# Patient Record
Sex: Female | Born: 1955
Health system: Southern US, Community
[De-identification: ages and names within clinical notes are randomized; demographics above are authoritative.]

## PROBLEM LIST (undated history)

## (undated) DIAGNOSIS — M810 Age-related osteoporosis without current pathological fracture: Secondary | ICD-10-CM

## (undated) DIAGNOSIS — I10 Essential (primary) hypertension: Secondary | ICD-10-CM

## (undated) DIAGNOSIS — R200 Anesthesia of skin: Secondary | ICD-10-CM

## (undated) DIAGNOSIS — G43909 Migraine, unspecified, not intractable, without status migrainosus: Secondary | ICD-10-CM

## (undated) HISTORY — PX: WISDOM TOOTH EXTRACTION: SHX21

## (undated) HISTORY — DX: Migraine, unspecified, not intractable, without status migrainosus: G43.909

## (undated) HISTORY — DX: Age-related osteoporosis without current pathological fracture: M81.0

## (undated) HISTORY — DX: Anesthesia of skin: R20.0

## (undated) HISTORY — PX: TOOTH EXTRACTION: SUR596

## (undated) HISTORY — DX: Essential (primary) hypertension: I10

---

## 1998-07-28 ENCOUNTER — Ambulatory Visit (HOSPITAL_COMMUNITY): Admission: RE | Admit: 1998-07-28 | Discharge: 1998-07-28 | Payer: Self-pay

## 2000-10-16 ENCOUNTER — Ambulatory Visit (HOSPITAL_COMMUNITY): Admission: RE | Admit: 2000-10-16 | Discharge: 2000-10-16 | Payer: Self-pay | Admitting: Obstetrics and Gynecology

## 2000-10-16 ENCOUNTER — Encounter: Payer: Self-pay | Admitting: Obstetrics and Gynecology

## 2001-06-03 ENCOUNTER — Encounter: Payer: Self-pay | Admitting: Internal Medicine

## 2001-06-03 ENCOUNTER — Encounter: Admission: RE | Admit: 2001-06-03 | Discharge: 2001-06-03 | Payer: Self-pay | Admitting: Internal Medicine

## 2002-01-23 ENCOUNTER — Encounter: Payer: Self-pay | Admitting: Obstetrics and Gynecology

## 2002-01-23 ENCOUNTER — Ambulatory Visit (HOSPITAL_COMMUNITY): Admission: RE | Admit: 2002-01-23 | Discharge: 2002-01-23 | Payer: Self-pay | Admitting: Obstetrics and Gynecology

## 2003-05-17 ENCOUNTER — Ambulatory Visit (HOSPITAL_COMMUNITY): Admission: RE | Admit: 2003-05-17 | Discharge: 2003-05-17 | Payer: Self-pay | Admitting: Obstetrics and Gynecology

## 2003-05-31 ENCOUNTER — Ambulatory Visit (HOSPITAL_COMMUNITY): Admission: RE | Admit: 2003-05-31 | Discharge: 2003-05-31 | Payer: Self-pay | Admitting: Internal Medicine

## 2005-04-25 ENCOUNTER — Ambulatory Visit (HOSPITAL_COMMUNITY): Admission: RE | Admit: 2005-04-25 | Discharge: 2005-04-25 | Payer: Self-pay | Admitting: Obstetrics and Gynecology

## 2006-07-23 ENCOUNTER — Ambulatory Visit (HOSPITAL_COMMUNITY): Admission: RE | Admit: 2006-07-23 | Discharge: 2006-07-23 | Payer: Self-pay | Admitting: Obstetrics & Gynecology

## 2006-12-09 ENCOUNTER — Ambulatory Visit: Payer: Self-pay | Admitting: Gastroenterology

## 2006-12-09 ENCOUNTER — Ambulatory Visit (HOSPITAL_COMMUNITY): Admission: RE | Admit: 2006-12-09 | Discharge: 2006-12-09 | Payer: Self-pay | Admitting: Gastroenterology

## 2007-08-14 ENCOUNTER — Ambulatory Visit (HOSPITAL_COMMUNITY): Admission: RE | Admit: 2007-08-14 | Discharge: 2007-08-14 | Payer: Self-pay | Admitting: Internal Medicine

## 2007-08-22 ENCOUNTER — Ambulatory Visit (HOSPITAL_COMMUNITY): Admission: RE | Admit: 2007-08-22 | Discharge: 2007-08-22 | Payer: Self-pay | Admitting: Obstetrics & Gynecology

## 2008-09-17 ENCOUNTER — Ambulatory Visit (HOSPITAL_COMMUNITY): Admission: RE | Admit: 2008-09-17 | Discharge: 2008-09-17 | Payer: Self-pay | Admitting: Obstetrics & Gynecology

## 2008-09-22 ENCOUNTER — Encounter: Admission: RE | Admit: 2008-09-22 | Discharge: 2008-09-22 | Payer: Self-pay | Admitting: Obstetrics & Gynecology

## 2009-10-24 ENCOUNTER — Ambulatory Visit (HOSPITAL_COMMUNITY): Admission: RE | Admit: 2009-10-24 | Discharge: 2009-10-24 | Payer: Self-pay | Admitting: Obstetrics & Gynecology

## 2010-09-12 NOTE — Op Note (Signed)
Traci Strong, Traci Strong                 ACCOUNT NO.:  [id number]   MEDICAL RECORD NO.:  [id number]          PATIENT TYPE:  AMB   LOCATION:  DAY                           FACILITY:  [facility]   PHYSICIAN:  Kassie Mends, M.D.      DATE OF BIRTH:  1955-05-19   DATE OF PROCEDURE:  12/09/2006  DATE OF DISCHARGE:                               OPERATIVE REPORT   PROCEDURE:  Colonoscopy.   INDICATION FOR EXAM:  Traci Strong is a 55 year old female who presents for  average-risk colon cancer screening.   FINDINGS:  1. Sigmoid colon diverticulosis.  Otherwise, no polyps, masses,      inflammatory changes, or AVMs seen.  2. Moderate internal hemorrhoids.  Otherwise, normal retroflexed view      of the rectum.   RECOMMENDATIONS:  1. She should follow a high-fiber diet.  She was given a handout on a      high-fiber diet, hemorrhoids, and diverticulosis.  2. Screening colonoscopy in 10 years.   MEDICATIONS:  1. Demerol 75 mg IV.  2. Versed 4 mg IV.   PROCEDURE TECHNIQUE:  Physical exam was performed, and informed consent  was obtained from the patient after explaining the benefits, risks, and  alternatives to the procedure.  The patient was connected to the monitor  and placed in the left lateral position.  Continuous oxygen was provided  by nasal cannula and IV medicine administered through an indwelling  cannula.  After administration of sedation and rectal exam, the  patient's rectum was intubated, and the scope was advanced under direct  visualization to the cecum.  The scope was withdrawn slowly by carefully  examining the color, texture, anatomy, and integrity of the mucosa on  the way out.  The patient was recovered in endoscopy and discharged home  in satisfactory condition.      Kassie Mends, M.D.  Electronically Signed    SM/MEDQ  D:  12/09/2006  T:  12/10/2006  Job:  [id number]   cc:   Tesfaye D. Felecia Shelling, MD  Fax: [phone]

## 2010-10-03 ENCOUNTER — Other Ambulatory Visit (HOSPITAL_COMMUNITY): Payer: Self-pay | Admitting: Obstetrics & Gynecology

## 2010-10-03 DIAGNOSIS — Z1231 Encounter for screening mammogram for malignant neoplasm of breast: Secondary | ICD-10-CM

## 2010-11-02 ENCOUNTER — Ambulatory Visit (HOSPITAL_COMMUNITY)
Admission: RE | Admit: 2010-11-02 | Discharge: 2010-11-02 | Disposition: A | Discharge status: Home / Self Care | Payer: BC Managed Care – PPO | Source: Ambulatory Visit | Attending: Obstetrics & Gynecology | Admitting: Obstetrics & Gynecology

## 2010-11-02 DIAGNOSIS — Z1231 Encounter for screening mammogram for malignant neoplasm of breast: Secondary | ICD-10-CM

## 2011-10-15 ENCOUNTER — Other Ambulatory Visit (HOSPITAL_COMMUNITY): Payer: Self-pay | Admitting: Obstetrics & Gynecology

## 2011-10-15 DIAGNOSIS — Z1231 Encounter for screening mammogram for malignant neoplasm of breast: Secondary | ICD-10-CM

## 2011-11-14 ENCOUNTER — Ambulatory Visit (HOSPITAL_COMMUNITY)
Admission: RE | Admit: 2011-11-14 | Discharge: 2011-11-14 | Disposition: A | Discharge status: Home / Self Care | Payer: BC Managed Care – PPO | Source: Ambulatory Visit | Attending: Obstetrics & Gynecology | Admitting: Obstetrics & Gynecology

## 2011-11-14 DIAGNOSIS — Z1231 Encounter for screening mammogram for malignant neoplasm of breast: Secondary | ICD-10-CM | POA: Insufficient documentation

## 2012-09-30 ENCOUNTER — Other Ambulatory Visit (HOSPITAL_COMMUNITY): Payer: Self-pay | Admitting: Internal Medicine

## 2012-09-30 ENCOUNTER — Ambulatory Visit (HOSPITAL_COMMUNITY)
Admission: RE | Admit: 2012-09-30 | Discharge: 2012-09-30 | Disposition: A | Discharge status: Home / Self Care | Payer: BC Managed Care – PPO | Source: Ambulatory Visit | Attending: Internal Medicine | Admitting: Internal Medicine

## 2012-09-30 DIAGNOSIS — W19XXXA Unspecified fall, initial encounter: Secondary | ICD-10-CM

## 2012-09-30 DIAGNOSIS — M25539 Pain in unspecified wrist: Secondary | ICD-10-CM | POA: Insufficient documentation

## 2012-09-30 DIAGNOSIS — M79609 Pain in unspecified limb: Secondary | ICD-10-CM | POA: Insufficient documentation

## 2012-09-30 DIAGNOSIS — S6990XA Unspecified injury of unspecified wrist, hand and finger(s), initial encounter: Secondary | ICD-10-CM | POA: Insufficient documentation

## 2012-09-30 DIAGNOSIS — S59909A Unspecified injury of unspecified elbow, initial encounter: Secondary | ICD-10-CM | POA: Insufficient documentation

## 2012-09-30 DIAGNOSIS — S59919A Unspecified injury of unspecified forearm, initial encounter: Secondary | ICD-10-CM | POA: Insufficient documentation

## 2012-10-16 ENCOUNTER — Other Ambulatory Visit (HOSPITAL_COMMUNITY): Payer: Self-pay | Admitting: Obstetrics & Gynecology

## 2012-10-16 DIAGNOSIS — Z1231 Encounter for screening mammogram for malignant neoplasm of breast: Secondary | ICD-10-CM

## 2012-12-03 ENCOUNTER — Ambulatory Visit (HOSPITAL_COMMUNITY)
Admission: RE | Admit: 2012-12-03 | Discharge: 2012-12-03 | Disposition: A | Discharge status: Home / Self Care | Payer: BC Managed Care – PPO | Source: Ambulatory Visit | Attending: Obstetrics & Gynecology | Admitting: Obstetrics & Gynecology

## 2012-12-03 DIAGNOSIS — Z1231 Encounter for screening mammogram for malignant neoplasm of breast: Secondary | ICD-10-CM | POA: Insufficient documentation

## 2014-03-02 ENCOUNTER — Ambulatory Visit (HOSPITAL_COMMUNITY)
Admission: RE | Admit: 2014-03-02 | Discharge: 2014-03-02 | Disposition: A | Discharge status: Home / Self Care | Payer: BC Managed Care – PPO | Source: Ambulatory Visit | Attending: Internal Medicine | Admitting: Internal Medicine

## 2014-03-02 ENCOUNTER — Other Ambulatory Visit (HOSPITAL_COMMUNITY): Payer: Self-pay | Admitting: Internal Medicine

## 2014-03-02 DIAGNOSIS — W19XXXA Unspecified fall, initial encounter: Secondary | ICD-10-CM | POA: Insufficient documentation

## 2014-03-02 DIAGNOSIS — M25512 Pain in left shoulder: Secondary | ICD-10-CM

## 2014-03-26 ENCOUNTER — Other Ambulatory Visit (HOSPITAL_COMMUNITY): Payer: Self-pay | Admitting: Obstetrics & Gynecology

## 2014-03-26 DIAGNOSIS — Z1231 Encounter for screening mammogram for malignant neoplasm of breast: Secondary | ICD-10-CM

## 2014-04-12 ENCOUNTER — Ambulatory Visit (HOSPITAL_COMMUNITY): Payer: BC Managed Care – PPO

## 2015-09-27 ENCOUNTER — Encounter: Payer: Self-pay | Admitting: Obstetrics & Gynecology

## 2016-09-27 ENCOUNTER — Other Ambulatory Visit: Payer: Self-pay | Admitting: Obstetrics & Gynecology

## 2016-09-27 DIAGNOSIS — Z1231 Encounter for screening mammogram for malignant neoplasm of breast: Secondary | ICD-10-CM

## 2016-10-04 ENCOUNTER — Ambulatory Visit (INDEPENDENT_AMBULATORY_CARE_PROVIDER_SITE_OTHER): Payer: BC Managed Care – PPO | Admitting: Obstetrics & Gynecology

## 2016-10-04 ENCOUNTER — Encounter: Payer: Self-pay | Admitting: Obstetrics & Gynecology

## 2016-10-04 ENCOUNTER — Ambulatory Visit
Admission: RE | Admit: 2016-10-04 | Discharge: 2016-10-04 | Disposition: A | Discharge status: Home / Self Care | Payer: BC Managed Care – PPO | Source: Ambulatory Visit | Attending: Obstetrics & Gynecology | Admitting: Obstetrics & Gynecology

## 2016-10-04 VITALS — BP 130/86 | Ht 61.5 in | Wt 163.0 lb

## 2016-10-04 DIAGNOSIS — Z01419 Encounter for gynecological examination (general) (routine) without abnormal findings: Secondary | ICD-10-CM | POA: Diagnosis not present

## 2016-10-04 DIAGNOSIS — Z780 Asymptomatic menopausal state: Secondary | ICD-10-CM

## 2016-10-04 DIAGNOSIS — Z1231 Encounter for screening mammogram for malignant neoplasm of breast: Secondary | ICD-10-CM

## 2016-10-04 NOTE — Addendum Note (Signed)
Addended by: Berna SpareASTILLO, Sou Nohr on: 10/04/2016 10:35 AM   Modules accepted: Orders, SmartSet

## 2016-10-04 NOTE — Progress Notes (Signed)
    Traci Strong 06-Nov-1955 [id number]   History:    61 y.o. G1P1 divorced/retired.  Boyfriend but not sexually active  RP:  Established patient presenting for annual gyn exam   HPI:  Menopause.  No HRT.  No PMB.  No pelvic pain.  No vaginal d/c.  Breasts wnl.  Mictions/BMs wnl.  Past medical history,surgical history, family history and social history were all reviewed and documented in the EPIC chart.  Gynecologic History No LMP recorded. Patient is postmenopausal. Contraception: abstinence and post menopausal status Last Pap: 08/2015. Results were: normal/HPV HR neg (but h/o HPV HR pos) Last mammogram: 08/2015. Results were: normal  Obstetric History OB History  Gravida Para Term Preterm AB Living  1 1       1   SAB TAB Ectopic Multiple Live Births               # Outcome Date GA Lbr Len/2nd Weight Sex Delivery Anes PTL Lv  1 Para                ROS: A ROS was performed and pertinent positives and negatives are included in the history.  GENERAL: No fevers or chills. HEENT: No change in vision, no earache, sore throat or sinus congestion. NECK: No pain or stiffness. CARDIOVASCULAR: No chest pain or pressure. No palpitations. PULMONARY: No shortness of breath, cough or wheeze. GASTROINTESTINAL: No abdominal pain, nausea, vomiting or diarrhea, melena or bright red blood per rectum. GENITOURINARY: No urinary frequency, urgency, hesitancy or dysuria. MUSCULOSKELETAL: No joint or muscle pain, no back pain, no recent trauma. DERMATOLOGIC: No rash, no itching, no lesions. ENDOCRINE: No polyuria, polydipsia, no heat or cold intolerance. No recent change in weight. HEMATOLOGICAL: No anemia or easy bruising or bleeding. NEUROLOGIC: No headache, seizures, numbness, tingling or weakness. PSYCHIATRIC: No depression, no loss of interest in normal activity or change in sleep pattern.     Exam:   BP 130/86   Ht 5' 1.5" (1.562 m)   Wt 163 lb (73.9 kg)   BMI 30.30 kg/m   Body mass index is  30.3 kg/m.  General appearance : Well developed well nourished female. No acute distress HEENT: Eyes: no retinal hemorrhage or exudates,  Neck supple, trachea midline, no carotid bruits, no thyroidmegaly Lungs: Clear to auscultation, no rhonchi or wheezes, or rib retractions  Heart: Regular rate and rhythm, no murmurs or gallops Breast:Examined in sitting and supine position were symmetrical in appearance, no palpable masses or tenderness,  no skin retraction, no nipple inversion, no nipple discharge, no skin discoloration, no axillary or supraclavicular lymphadenopathy Abdomen: no palpable masses or tenderness, no rebound or guarding Extremities: no edema or skin discoloration or tenderness  Pelvic:  Bartholin, Urethra, Skene Glands: Within normal limits             Vagina: No gross lesions or discharge  Cervix: No gross lesions or discharge.  Pap reflex done  Uterus  AV, normal size, shape and consistency, non-tender and mobile  Adnexa  Without masses or tenderness  Anus and perineum  normal      Assessment/Plan:  61 y.o. female for annual exam   1. Encounter for routine gynecological examination with Papanicolaou smear of cervix Normal Gyn exam.  Pap reflex done re h/o HPV HR pos.  Breasts wnl.  [facility]  2. Menopause present No HRT.  No PMB.    Genia DelMarie-Lyne Chasity Outten MD, 9:51 AM 10/04/2016

## 2016-10-04 NOTE — Patient Instructions (Signed)
1. Encounter for routine gynecological examination with Papanicolaou smear of cervix Normal Gyn exam.  Pap reflex done re h/o HPV HR pos.  Breasts wnl.  [facility].  2. Menopause present No HRT.  No PMB.    Traci Strong, it was a pleasure to see you today!  I will inform you of your results as soon as available.

## 2016-10-05 LAB — PAP IG W/ RFLX HPV ASCU

## 2016-12-03 ENCOUNTER — Telehealth: Payer: Self-pay | Admitting: Gastroenterology

## 2016-12-03 NOTE — Telephone Encounter (Signed)
Sept recall for tcs °

## 2016-12-04 NOTE — Telephone Encounter (Signed)
Letter mailed to pt.  

## 2017-08-13 ENCOUNTER — Other Ambulatory Visit: Payer: Self-pay | Admitting: Obstetrics & Gynecology

## 2017-08-13 DIAGNOSIS — Z139 Encounter for screening, unspecified: Secondary | ICD-10-CM

## 2017-10-16 ENCOUNTER — Encounter: Payer: BC Managed Care – PPO | Admitting: Women's Health

## 2017-10-16 ENCOUNTER — Ambulatory Visit
Admission: RE | Admit: 2017-10-16 | Discharge: 2017-10-16 | Disposition: A | Discharge status: Home / Self Care | Payer: BC Managed Care – PPO | Source: Ambulatory Visit | Attending: Obstetrics & Gynecology | Admitting: Obstetrics & Gynecology

## 2017-10-16 DIAGNOSIS — Z139 Encounter for screening, unspecified: Secondary | ICD-10-CM

## 2017-10-17 ENCOUNTER — Encounter: Payer: Self-pay | Admitting: Obstetrics & Gynecology

## 2017-10-17 ENCOUNTER — Ambulatory Visit: Payer: BC Managed Care – PPO | Admitting: Obstetrics & Gynecology

## 2017-10-17 VITALS — BP 132/82 | Ht 60.5 in | Wt 164.0 lb

## 2017-10-17 DIAGNOSIS — Z01419 Encounter for gynecological examination (general) (routine) without abnormal findings: Secondary | ICD-10-CM | POA: Diagnosis not present

## 2017-10-17 DIAGNOSIS — Z780 Asymptomatic menopausal state: Secondary | ICD-10-CM | POA: Diagnosis not present

## 2017-10-17 DIAGNOSIS — Z13820 Encounter for screening for osteoporosis: Secondary | ICD-10-CM | POA: Diagnosis not present

## 2017-10-17 NOTE — Progress Notes (Signed)
Traci Strong 10/09/1955 [id number]   History:    62 y.o. G1P1L1 Divorced.  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  No PMB.  No pelvic pain.  Normal vaginal secretions.  Abstinent.  Breasts wnl.  Urine/BMs wnl.  BMI 31.50.  Planning to get back into fitness.  Health labs with Fam MD.  Past medical history,surgical history, family history and social history were all reviewed and documented in the EPIC chart.  Gynecologic History No LMP recorded. Patient is postmenopausal. Contraception: abstinence and post menopausal status Last Pap: 09/2016. Results were: Negative.   Last mammogram: 10/16/2017. Results were: Negative Bone Density: 10 yrs ago per patient, will schedule here now Colonoscopy: Will organize through her Fam MD, may do Cologuard  Obstetric History OB History  Gravida Para Term Preterm AB Living  1 1       1   SAB TAB Ectopic Multiple Live Births               # Outcome Date GA Lbr Len/2nd Weight Sex Delivery Anes PTL Lv  1 Para              ROS: A ROS was performed and pertinent positives and negatives are included in the history.  GENERAL: No fevers or chills. HEENT: No change in vision, no earache, sore throat or sinus congestion. NECK: No pain or stiffness. CARDIOVASCULAR: No chest pain or pressure. No palpitations. PULMONARY: No shortness of breath, cough or wheeze. GASTROINTESTINAL: No abdominal pain, nausea, vomiting or diarrhea, melena or bright red blood per rectum. GENITOURINARY: No urinary frequency, urgency, hesitancy or dysuria. MUSCULOSKELETAL: No joint or muscle pain, no back pain, no recent trauma. DERMATOLOGIC: No rash, no itching, no lesions. ENDOCRINE: No polyuria, polydipsia, no heat or cold intolerance. No recent change in weight. HEMATOLOGICAL: No anemia or easy bruising or bleeding. NEUROLOGIC: No headache, seizures, numbness, tingling or weakness. PSYCHIATRIC: No  depression, no loss of interest in normal activity or change in sleep pattern.     Exam:   BP 132/82   Ht 5' 0.5" (1.537 m)   Wt 164 lb (74.4 kg)   BMI 31.50 kg/m   Body mass index is 31.5 kg/m.  General appearance : Well developed well nourished female. No acute distress HEENT: Eyes: no retinal hemorrhage or exudates,  Neck supple, trachea midline, no carotid bruits, no thyroidmegaly Lungs: Clear to auscultation, no rhonchi or wheezes, or rib retractions  Heart: Regular rate and rhythm, no murmurs or gallops Breast:Examined in sitting and supine position were symmetrical in appearance, no palpable masses or tenderness,  no skin retraction, no nipple inversion, no nipple discharge, no skin discoloration, no axillary or supraclavicular lymphadenopathy Abdomen: no palpable masses or tenderness, no rebound or guarding Extremities: no edema or skin discoloration or tenderness  Pelvic: Vulva: Normal             Vagina: No gross lesions or discharge  Cervix: No gross lesions or discharge  Uterus  AV, normal size, shape and consistency, non-tender and mobile  Adnexa  Without masses or tenderness  Anus: Normal   Assessment/Plan:  62 y.o. female for annual exam   1. Well female exam with routine gynecological exam Normal gynecologic exam and menopause.  Pap test negative in June 2018.  Breast exam normal.  Screening mammogram negative in June 2019.  Health labs with family physician.  Will organize colonoscopy or Cologuard through her  family physician.  2. Menopause present Well on no hormone replacement therapy.  No postmenopausal bleeding.  3. Screening for osteoporosis Recommend vitamin D supplements, calcium rich nutrition and regular weightbearing physical activity.  Will schedule bone density here now. - DG Bone Density; Future  [name] MD, 9:40 AM 10/17/2017

## 2017-10-17 NOTE — Patient Instructions (Signed)
1. Well female exam with routine gynecological exam Normal gynecologic exam and menopause.  Pap test negative in June 2018.  Breast exam normal.  Screening mammogram negative in June 2019.  Health labs with family physician.  Will organize colonoscopy or Cologuard through her family physician.  2. Menopause present Well on no hormone replacement therapy.  No postmenopausal bleeding.  3. Screening for osteoporosis Recommend vitamin D supplements, calcium rich nutrition and regular weightbearing physical activity.  Will schedule bone density here now. - DG Bone Density; Future  Irving Burtonmily, it was a pleasure seeing you today!  I will inform you of your bone density results when available.

## 2017-10-21 ENCOUNTER — Other Ambulatory Visit: Payer: Self-pay | Admitting: Gynecology

## 2017-10-21 DIAGNOSIS — Z13820 Encounter for screening for osteoporosis: Secondary | ICD-10-CM

## 2017-10-24 ENCOUNTER — Ambulatory Visit (INDEPENDENT_AMBULATORY_CARE_PROVIDER_SITE_OTHER): Payer: BC Managed Care – PPO

## 2017-10-24 ENCOUNTER — Other Ambulatory Visit: Payer: Self-pay | Admitting: Gynecology

## 2017-10-24 DIAGNOSIS — Z13820 Encounter for screening for osteoporosis: Secondary | ICD-10-CM

## 2017-10-24 DIAGNOSIS — M810 Age-related osteoporosis without current pathological fracture: Secondary | ICD-10-CM | POA: Diagnosis not present

## 2017-10-28 ENCOUNTER — Encounter: Payer: Self-pay | Admitting: Gynecology

## 2017-10-28 ENCOUNTER — Telehealth: Payer: Self-pay | Admitting: Gynecology

## 2017-10-28 DIAGNOSIS — M810 Age-related osteoporosis without current pathological fracture: Secondary | ICD-10-CM

## 2017-10-28 HISTORY — DX: Age-related osteoporosis without current pathological fracture: M81.0

## 2017-10-28 NOTE — Telephone Encounter (Signed)
Tell patient her most recent bone density shows osteoporosis. Recommend office visit with Dr Lavoie to discuss treatment options 

## 2017-10-29 NOTE — Telephone Encounter (Signed)
Patient informed, transferred to front desk.

## 2017-12-10 ENCOUNTER — Ambulatory Visit: Payer: BLUE CROSS/BLUE SHIELD | Admitting: Obstetrics & Gynecology

## 2017-12-10 ENCOUNTER — Encounter: Payer: Self-pay | Admitting: Obstetrics & Gynecology

## 2017-12-10 VITALS — BP 126/84

## 2017-12-10 DIAGNOSIS — M810 Age-related osteoporosis without current pathological fracture: Secondary | ICD-10-CM

## 2017-12-10 MED ORDER — RISEDRONATE SODIUM 150 MG PO TABS
150.0000 mg | ORAL_TABLET | ORAL | 4 refills | Status: DC
Start: 2017-12-10 — End: 2018-11-20

## 2017-12-10 NOTE — Progress Notes (Signed)
    Traci Strong 07/21/1955 [id number]        62 y.o.  G1P1 Divorced.  Companion.  RP: Management of Osteoporosis  HPI: No change since visit on 10/17/2017:  Menopause, well on no HRT.  No PMB.  No pelvic pain.  Normal vaginal secretions.  Abstinent.  Breasts wnl.  Urine/BMs wnl.  BMI 31.50.  Planning to get back into fitness.  Health labs with Fam MD.    OB History  Gravida Para Term Preterm AB Living  1 1       1   SAB TAB Ectopic Multiple Live Births               # Outcome Date GA Lbr Len/2nd Weight Sex Delivery Anes PTL Lv  1 Para             Past medical history,surgical history, problem list, medications, allergies, family history and social history were all reviewed and documented in the EPIC chart.   Directed ROS with pertinent positives and negatives documented in the history of present illness/assessment and plan.  Exam:  Vitals:   12/10/17 1522  BP: 126/84   General appearance:  Normal  Bone Density 10/24/2017:  Lumbar spine L1, L3-4 T-Score -2.7.  Other sites with Osteopenia.   Assessment/Plan:  62 y.o. G1P1   1. Age-related osteoporosis without current pathological fracture Menopause on no hormone replacement therapy.  Osteoporosis with a T score at -2.7 at the lumbar spine on bone density June 2019.  No history of fragility fracture.  No particular fall risk.  Risks and complications associated with fractures reviewed.  No history of GERD.  Management of osteoporosis discussed thoroughly with patient.  Different treatment options reviewed.  Decision to start on Risedronate (Actonel) 150 mg tablet per mouth monthly.  Usage, risks and benefits reviewed with patient.  If tolerates well, will continue at least 2 years when a repeat bone density will be done.  Also strongly recommended to take vitamin D supplements, a vitamin D level was drawn today, calcium intake of 1.2 to 1.5 g/day including nutritional and supplemental calcium, and increased weightbearing physical  activity also recommended. - VITAMIN D 25 Hydroxy (Vit-D Deficiency, Fractures)  Other orders - risedronate (ACTONEL) 150 MG tablet; Take 1 tablet (150 mg total) by mouth every 30 (thirty) days. with water on empty stomach, nothing by mouth or lie down for next 30 minutes.  Counseling on above issues and coordination of care more than 50% for 25 minutes.  Genia DelMarie-Lyne Maleena Eddleman MD, 3:40 PM 12/10/2017

## 2017-12-11 LAB — VITAMIN D 25 HYDROXY (VIT D DEFICIENCY, FRACTURES): Vit D, 25-Hydroxy: 32 ng/mL (ref 30–100)

## 2017-12-12 ENCOUNTER — Encounter: Payer: Self-pay | Admitting: Obstetrics & Gynecology

## 2017-12-12 NOTE — Patient Instructions (Addendum)
1. Age-related osteoporosis without current pathological fracture Menopause on no hormone replacement therapy.  Osteoporosis with a T score at -2.7 at the lumbar spine on bone density June 2019.  No history of fragility fracture.  No particular fall risk.  Risks and complications associated with fractures reviewed.  No history of GERD.  Management of osteoporosis discussed thoroughly with patient.  Different treatment options reviewed.  Decision to start on Risedronate (Actonel) 150 mg tablet per mouth monthly.  Usage, risks and benefits reviewed with patient.  If tolerates well, will continue at least 2 years when a repeat bone density will be done.  Also strongly recommended to take vitamin D supplements, a vitamin D level was drawn today, calcium intake of 1.2 to 1.5 g/day including nutritional and supplemental calcium, and increased weightbearing physical activity also recommended. - VITAMIN D 25 Hydroxy (Vit-D Deficiency, Fractures)  Other orders - risedronate (ACTONEL) 150 MG tablet; Take 1 tablet (150 mg total) by mouth every 30 (thirty) days. with water on empty stomach, nothing by mouth or lie down for next 30 minutes.  Traci Strong, good seeing you today!   Osteoporosis Osteoporosis is the thinning and loss of density in the bones. Osteoporosis makes the bones more brittle, fragile, and likely to break (fracture). Over time, osteoporosis can cause the bones to become so weak that they fracture after a simple fall. The bones most likely to fracture are the bones in the hip, wrist, and spine. What are the causes? The exact cause is not known. What increases the risk? Anyone can develop osteoporosis. You may be at greater risk if you have a family history of the condition or have poor nutrition. You may also have a higher risk if you are:  Female.  64 years old or older.  A smoker.  Not physically active.  White or Asian.  Slender.  What are the signs or symptoms? A fracture might be  the first sign of the disease, especially if it results from a fall or injury that would not usually cause a bone to break. Other signs and symptoms include:  Low back and neck pain.  Stooped posture.  Height loss.  How is this diagnosed? To make a diagnosis, your health care provider may:  Take a medical history.  Perform a physical exam.  Order tests, such as: ? A bone mineral density test. ? A dual-energy X-ray absorptiometry test.  How is this treated? The goal of osteoporosis treatment is to strengthen your bones to reduce your risk of a fracture. Treatment may involve:  Making lifestyle changes, such as: ? Eating a diet rich in calcium. ? Doing weight-bearing and muscle-strengthening exercises. ? Stopping tobacco use. ? Limiting alcohol intake.  Taking medicine to slow the process of bone loss or to increase bone density.  Monitoring your levels of calcium and vitamin D.  Follow these instructions at home:  Include calcium and vitamin D in your diet. Calcium is important for bone health, and vitamin D helps the body absorb calcium.  Perform weight-bearing and muscle-strengthening exercises as directed by your health care provider.  Do not use any tobacco products, including cigarettes, chewing tobacco, and electronic cigarettes. If you need help quitting, ask your health care provider.  Limit your alcohol intake.  Take medicines only as directed by your health care provider.  Keep all follow-up visits as directed by your health care provider. This is important.  Take precautions at home to lower your risk of falling, such as: ? Keeping  rooms well lit and clutter free. ? Installing safety rails on stairs. ? Using rubber mats in the bathroom and other areas that are often wet or slippery. Get help right away if: You fall or injure yourself. This information is not intended to replace advice given to you by your health care provider. Make sure you discuss any  questions you have with your health care provider. Document Released: 01/24/2005 Document Revised: 09/19/2015 Document Reviewed: 09/24/2013 Elsevier Interactive Patient Education  [employer]2018 [employer]

## 2017-12-13 ENCOUNTER — Encounter: Payer: Self-pay | Admitting: *Deleted

## 2018-10-20 ENCOUNTER — Other Ambulatory Visit: Payer: Self-pay | Admitting: Obstetrics & Gynecology

## 2018-10-20 DIAGNOSIS — Z1231 Encounter for screening mammogram for malignant neoplasm of breast: Secondary | ICD-10-CM

## 2018-11-19 ENCOUNTER — Other Ambulatory Visit: Payer: Self-pay

## 2018-11-20 ENCOUNTER — Ambulatory Visit: Payer: BC Managed Care – PPO | Admitting: Obstetrics & Gynecology

## 2018-11-20 ENCOUNTER — Encounter: Payer: Self-pay | Admitting: Obstetrics & Gynecology

## 2018-11-20 VITALS — BP 130/80 | Ht 60.0 in | Wt 145.0 lb

## 2018-11-20 DIAGNOSIS — Z780 Asymptomatic menopausal state: Secondary | ICD-10-CM | POA: Diagnosis not present

## 2018-11-20 DIAGNOSIS — M810 Age-related osteoporosis without current pathological fracture: Secondary | ICD-10-CM

## 2018-11-20 DIAGNOSIS — E663 Overweight: Secondary | ICD-10-CM

## 2018-11-20 DIAGNOSIS — Z01419 Encounter for gynecological examination (general) (routine) without abnormal findings: Secondary | ICD-10-CM | POA: Diagnosis not present

## 2018-11-20 MED ORDER — RISEDRONATE SODIUM 150 MG PO TABS: 150.0000 mg | ORAL_TABLET | ORAL | 4 refills | Status: DC

## 2018-11-20 NOTE — Progress Notes (Signed)
Traci Strong 1956/02/23 [id number]   History:    63 y.o. G1P1L1 Divorced.  Stable companion.  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  RP: Established patient presenting for annual gyn exam   HPI: Postmenopause, well on no HRT.  No PMB.  No pelvic pain.  Abstinent.  Urine/BMs normal.  Breasts normal.  BMI improved x last year, now 28.32.  Physically active, but will start walking more for fitness.  Health labs with Fam MD.  Past medical history,surgical history, family history and social history were all reviewed and documented in the EPIC chart.  Gynecologic History No LMP recorded. Patient is postmenopausal. Contraception: post menopausal status Last Pap: 09/2016. Results were: Negative Last mammogram: 09/2017. Results were: Negative Bone Density: Bone Density 09/2017 Osteoporosis -2.7 at the Spine on Actonel. Colonoscopy: >10 yrs  Obstetric History OB History  Gravida Para Term Preterm AB Living  1 1       1   SAB TAB Ectopic Multiple Live Births               # Outcome Date GA Lbr Len/2nd Weight Sex Delivery Anes PTL Lv  1 Para              ROS: A ROS was performed and pertinent positives and negatives are included in the history.  GENERAL: No fevers or chills. HEENT: No change in vision, no earache, sore throat or sinus congestion. NECK: No pain or stiffness. CARDIOVASCULAR: No chest pain or pressure. No palpitations. PULMONARY: No shortness of breath, cough or wheeze. GASTROINTESTINAL: No abdominal pain, nausea, vomiting or diarrhea, melena or bright red blood per rectum. GENITOURINARY: No urinary frequency, urgency, hesitancy or dysuria. MUSCULOSKELETAL: No joint or muscle pain, no back pain, no recent trauma. DERMATOLOGIC: No rash, no itching, no lesions. ENDOCRINE: No polyuria, polydipsia, no heat or cold intolerance. No recent change in weight. HEMATOLOGICAL: No anemia or easy bruising or bleeding. NEUROLOGIC: No headache, seizures, numbness, tingling or  weakness. PSYCHIATRIC: No depression, no loss of interest in normal activity or change in sleep pattern.     Exam:   Ht 5' (1.524 m)   Wt 145 lb (65.8 kg)   BMI 28.32 kg/m   Body mass index is 28.32 kg/m.  General appearance : Well developed well nourished female. No acute distress HEENT: Eyes: no retinal hemorrhage or exudates,  Neck supple, trachea midline, no carotid bruits, no thyroidmegaly Lungs: Clear to auscultation, no rhonchi or wheezes, or rib retractions  Heart: Regular rate and rhythm, no murmurs or gallops Breast:Examined in sitting and supine position were symmetrical in appearance, no palpable masses or tenderness,  no skin retraction, no nipple inversion, no nipple discharge, no skin discoloration, no axillary or supraclavicular lymphadenopathy Abdomen: no palpable masses or tenderness, no rebound or guarding Extremities: no edema or skin discoloration or tenderness  Pelvic: Vulva: Normal             Vagina: No gross lesions or discharge  Cervix: No gross lesions or discharge  Uterus  AV, normal size, shape and consistency, non-tender and mobile  Adnexa  Without masses or tenderness  Anus: Normal   Assessment/Plan:  63 y.o. female for annual exam   1. Well female exam with routine gynecological exam Normal gynecologic exam in menopause.  Pap test negative in June 2018, will repeat next year.  Breast exam normal.  Last mammogram June 2019 was negative.  Will repeat now.  Colonoscopy more than 10 years ago, organize colonoscopy  now.  Health labs with family physician.  2. Postmenopause Well on no hormone replacement therapy.  No postmenopausal bleeding.  3. Age-related osteoporosis without current pathological fracture Osteoporosis on last bone density in June 2019 with a T score of -2.7.  Continue with Actonel.  Actonel represcribed.  We will repeat a bone density June 2021.  Vitamin D supplements, calcium intake of 1200 mg daily and regular weightbearing  physical activities.  4. Overweight (BMI 25.0-29.9) Recommend a small decrease in calories/carbs using a diet such as [employer].  Aerobic physical activities 5 times a week and weightlifting every 2 days.  Other orders - risedronate (ACTONEL) 150 MG tablet; Take 1 tablet (150 mg total) by mouth every 30 (thirty) days. with water on empty stomach, nothing by mouth or lie down for next 30 minutes.  Princess Bruins MD, 12:09 PM 11/20/2018

## 2018-11-30 ENCOUNTER — Encounter: Payer: Self-pay | Admitting: Obstetrics & Gynecology

## 2018-11-30 NOTE — Patient Instructions (Signed)
1. Well female exam with routine gynecological exam Normal gynecologic exam in menopause.  Pap test negative in June 2018, will repeat next year.  Breast exam normal.  Last mammogram June 2019 was negative.  Will repeat now.  Colonoscopy more than 10 years ago, organize colonoscopy now.  Health labs with family physician.  2. Postmenopause Well on no hormone replacement therapy.  No postmenopausal bleeding.  3. Age-related osteoporosis without current pathological fracture Osteoporosis on last bone density in June 2019 with a T score of -2.7.  Continue with Actonel.  Actonel represcribed.  We will repeat a bone density June 2021.  Vitamin D supplements, calcium intake of 1200 mg daily and regular weightbearing physical activities.  4. Overweight (BMI 25.0-29.9) Recommend a small decrease in calories/carbs using a diet such as [employer].  Aerobic physical activities 5 times a week and weightlifting every 2 days.  Other orders - risedronate (ACTONEL) 150 MG tablet; Take 1 tablet (150 mg total) by mouth every 30 (thirty) days. with water on empty stomach, nothing by mouth or lie down for next 30 minutes.  Camela, it was a pleasure seeing you today!

## 2018-12-01 ENCOUNTER — Other Ambulatory Visit: Payer: Self-pay

## 2018-12-01 ENCOUNTER — Ambulatory Visit
Admission: RE | Admit: 2018-12-01 | Discharge: 2018-12-01 | Disposition: A | Discharge status: Home / Self Care | Payer: BC Managed Care – PPO | Source: Ambulatory Visit | Attending: Obstetrics & Gynecology | Admitting: Obstetrics & Gynecology

## 2018-12-01 DIAGNOSIS — Z1231 Encounter for screening mammogram for malignant neoplasm of breast: Secondary | ICD-10-CM

## 2019-01-27 ENCOUNTER — Encounter: Payer: Self-pay | Admitting: Gynecology

## 2019-08-27 ENCOUNTER — Ambulatory Visit (INDEPENDENT_AMBULATORY_CARE_PROVIDER_SITE_OTHER): Payer: Medicare PPO | Admitting: Psychology

## 2019-08-27 DIAGNOSIS — F4323 Adjustment disorder with mixed anxiety and depressed mood: Secondary | ICD-10-CM | POA: Diagnosis not present

## 2019-09-29 ENCOUNTER — Ambulatory Visit (INDEPENDENT_AMBULATORY_CARE_PROVIDER_SITE_OTHER): Payer: Medicare PPO | Admitting: Psychology

## 2019-09-29 DIAGNOSIS — F4323 Adjustment disorder with mixed anxiety and depressed mood: Secondary | ICD-10-CM | POA: Diagnosis not present

## 2019-10-07 ENCOUNTER — Other Ambulatory Visit: Payer: Self-pay | Admitting: Obstetrics & Gynecology

## 2019-10-07 DIAGNOSIS — Z1231 Encounter for screening mammogram for malignant neoplasm of breast: Secondary | ICD-10-CM

## 2019-10-15 ENCOUNTER — Ambulatory Visit: Payer: Medicare PPO | Admitting: Neurology

## 2019-10-15 ENCOUNTER — Encounter: Payer: Self-pay | Admitting: Neurology

## 2019-10-15 VITALS — BP 160/84 | HR 58 | Ht 60.0 in | Wt 163.0 lb

## 2019-10-15 DIAGNOSIS — R202 Paresthesia of skin: Secondary | ICD-10-CM | POA: Diagnosis not present

## 2019-10-15 DIAGNOSIS — G609 Hereditary and idiopathic neuropathy, unspecified: Secondary | ICD-10-CM | POA: Diagnosis not present

## 2019-10-15 NOTE — Progress Notes (Signed)
PATIENT: Traci Strong DOB: [birth year]  Chief Complaint  Patient presents with  . Peripheral Neuropathy    Report intermittent numbness on the bottom of all ten toes. Present for several years. She has never tried any medications for the symptoms.  Marland Kitchen PCP    Asencion Noble, MD     HISTORICAL  Traci Strong is a 64 year old female, seen in request by her primary care doctor Asencion Noble for evaluation of bilateral feet paresthesia, initial evaluation was on October 15, 2019.  I reviewed and summarized the referring note.  She has past medical history of hypertension, well controlled by low-dose Cozaar 100 mg daily Osteoporosis, taking Actonel 150 mg every 30 days Chronic migraine headaches, about once a month, responding well to Imitrex 50 mg as needed  Since 2016, she noticed bilateral toes, plantar surface numbness tingling, fairly symmetric, gradual onset, slowly progressive, is most noticeable at nighttime when she is trying to going to sleep, she noticed numbness tingling, but no burning pain, less noticeable during daytime  She denies gait abnormality, denies bowel and bladder incontinence, denies bilateral fingertips paresthesia,  She does complains of occasionally low back pain, radiating pain to left lower extremity,  I reviewed laboratory evaluation in December 2020, CMP showed elevated glucose 104, creatinine 1.1, CBC was normal, hemoglobin of 15.4, normal TSH, REVIEW OF SYSTEMS: Full 14 system review of systems performed and notable only for as above All other review of systems were negative.  ALLERGIES: Allergies  Allergen Reactions  . Codeine Other (See Comments)    Increase heart rate    HOME MEDICATIONS: Current Outpatient Medications  Medication Sig Dispense Refill  . losartan (COZAAR) 100 MG tablet Take 100 mg by mouth daily.    Marland Kitchen NADOLOL PO Take 120 mg by mouth.    . risedronate (ACTONEL) 150 MG tablet Take 1 tablet (150 mg total) by mouth every 30 (thirty)  days. with water on empty stomach, nothing by mouth or lie down for next 30 minutes. 3 tablet 4  . SUMAtriptan (IMITREX) 50 MG tablet Take 50 mg by mouth every 2 (two) hours as needed for migraine. May repeat in 2 hours if headache persists or recurs.     No current facility-administered medications for this visit.    PAST MEDICAL HISTORY: Past Medical History:  Diagnosis Date  . Hypertension   . Migraines   . Numbness   . Osteoporosis 10/2017   T score -2.7    PAST SURGICAL HISTORY: Past Surgical History:  Procedure Laterality Date  . TOOTH EXTRACTION    . WISDOM TOOTH EXTRACTION      FAMILY HISTORY: Family History  Problem Relation Age of Onset  . Hypertension Mother   . Heart attack Mother   . Parkinson's disease Mother   . Cancer Father        prostate  . Hypertension Father   . Aneurysm Father   . Cancer Brother        thyroid  . Hypertension Brother   . Diabetes Maternal Grandmother   . Heart attack Maternal Grandmother   . Breast cancer Paternal Grandmother   . Heart attack Paternal Grandmother   . Hypertension Daughter     SOCIAL HISTORY: Social History   Socioeconomic History  . Marital status: Divorced    Spouse name: Not on file  . Number of children: 1  . Years of education: college  . Highest education level: Master's degree (e.g., MA, MS, MEng, MEd, MSW, MBA)  Occupational History  . Occupation: Retired  Tobacco Use  . Smoking status: Never Smoker  . Smokeless tobacco: Never Used  Vaping Use  . Vaping Use: Never used  Substance and Sexual Activity  . Alcohol use: No  . Drug use: No  . Sexual activity: Never    Comment: 1st intercourse- 72, partners- 3,   Other Topics Concern  . Not on file  Social History Narrative   Lives alone.   Right-handed.   1-2 cups caffeine per day.   Social Determinants of Health   Financial Resource Strain:   . Difficulty of Paying Living Expenses:   Food Insecurity:   . Worried About Sales executive in the Last Year:   . Arboriculturist in the Last Year:   Transportation Needs:   . Film/video editor (Medical):   Marland Kitchen Lack of Transportation (Non-Medical):   Physical Activity:   . Days of Exercise per Week:   . Minutes of Exercise per Session:   Stress:   . Feeling of Stress :   Social Connections:   . Frequency of Communication with Friends and Family:   . Frequency of Social Gatherings with Friends and Family:   . Attends Religious Services:   . Active Member of Clubs or Organizations:   . Attends Archivist Meetings:   Marland Kitchen Marital Status:   Intimate Partner Violence:   . Fear of Current or Ex-Partner:   . Emotionally Abused:   Marland Kitchen Physically Abused:   . Sexually Abused:      PHYSICAL EXAM   Vitals:   10/15/19 1302  BP: (!) 160/84  Pulse: (!) 58  Weight: 163 lb (73.9 kg)  Height: 5' (1.524 m)   Not recorded     Body mass index is 31.83 kg/m.  PHYSICAL EXAMNIATION:  Gen: NAD, conversant, well nourised, well groomed                     Cardiovascular: Regular rate rhythm, no peripheral edema, warm, nontender. Eyes: Conjunctivae clear without exudates or hemorrhage Neck: Supple, no carotid bruits. Pulmonary: Clear to auscultation bilaterally   NEUROLOGICAL EXAM:  MENTAL STATUS: Speech:    Speech is normal; fluent and spontaneous with normal comprehension.  Cognition:     Orientation to time, place and person     Normal recent and remote memory     Normal Attention span and concentration     Normal Language, naming, repeating,spontaneous speech     Fund of knowledge   CRANIAL NERVES: CN II: Visual fields are full to confrontation. Pupils are round equal and briskly reactive to light. CN III, IV, VI: extraocular movement are normal. No ptosis. CN V: Facial sensation is intact to light touch CN VII: Face is symmetric with normal eye closure  CN VIII: Hearing is normal to causal conversation. CN IX, X: Phonation is normal. CN XI: Head  turning and shoulder shrug are intact  MOTOR: There is no pronator drift of out-stretched arms. Muscle bulk and tone are normal. Muscle strength is normal.  REFLEXES: Reflexes are 1 and symmetric at the biceps, triceps, knees, and absent at ankles. Plantar responses are flexor.  SENSORY: Decreased vibratory sensation at toes, length dependent decreased light touch pinprick to distal shin level  COORDINATION: There is no trunk or limb dysmetria noted.  GAIT/STANCE: Posture is normal. Gait is steady with normal steps, base, arm swing, and turning. Heel and toe walking are normal. Tandem gait is  normal.  Romberg is absent.   DIAGNOSTIC DATA (LABS, IMAGING, TESTING) - I reviewed patient records, labs, notes, testing and imaging myself where available.   ASSESSMENT AND PLAN  Traci Strong is a 64 y.o. female   Gradual onset bilateral toes paresthesia, Occasionally low back pain, radiating pain to left lower extremity  Length dependent sensory changes, absent ankle reflexes,  Differentiation diagnosis including peripheral neuropathy, lumbar radiculopathy  Laboratory evaluations  MRI of lumbar spine  EMG nerve conduction study   Orders Placed This Encounter  Procedures  . MR LUMBAR SPINE WO CONTRAST  . RPR  . Folate  . C-reactive protein  . CK  . Sedimentation rate  . Hgb A1c w/o eAG  . VITAMIN D 25 Hydroxy (Vit-D Deficiency, Fractures)  . B. burgdorfi antibodies  . Multiple Myeloma Panel (SPEP&IFE w/QIG)  . ANA w/Reflex if Positive  . NCV with EMG(electromyography)   Marcial Pacas, M.D. Ph.D.  [facility] [address], [place], [place] [address] Ph: [phone] Fax: [phone]  CC: Asencion Noble, MD  Referring Physician

## 2019-10-19 ENCOUNTER — Ambulatory Visit (INDEPENDENT_AMBULATORY_CARE_PROVIDER_SITE_OTHER): Payer: Medicare PPO | Admitting: Neurology

## 2019-10-19 ENCOUNTER — Other Ambulatory Visit: Payer: Self-pay

## 2019-10-19 ENCOUNTER — Encounter: Payer: Self-pay | Admitting: Neurology

## 2019-10-19 ENCOUNTER — Encounter: Payer: Medicare PPO | Admitting: Neurology

## 2019-10-19 DIAGNOSIS — G609 Hereditary and idiopathic neuropathy, unspecified: Secondary | ICD-10-CM | POA: Insufficient documentation

## 2019-10-19 DIAGNOSIS — Z0289 Encounter for other administrative examinations: Secondary | ICD-10-CM

## 2019-10-19 DIAGNOSIS — R202 Paresthesia of skin: Secondary | ICD-10-CM | POA: Diagnosis not present

## 2019-10-19 NOTE — Procedures (Signed)
Full Name: Traci Strong Gender: Female MRN #: [id number] Date of Birth: Dec 28, 1955    Visit Date: 10/19/2019 07:22 Age: 64 Years Examining Physician: Levert Feinstein, MD  Referring Physician: Levert Feinstein, MD Height: 5 feet 0 inch History: 64 year old female presented with gradual onset bilateral feet paresthesia  Summary of the tests:  Nerve conduction study:  Bilateral superficial peroneal sensory response showed moderately decreased snap amplitude.  Bilateral sural sensory responses were normal.  Bilateral tibial, superficial peroneal motor responses were normal  Electromyography:  Selected needle examination of right lower extremity muscles were normal.  Conclusion: This is a mild abnormal study.  There is electrodiagnostic evidence of mild less dependent axonal peripheral neuropathy, there is no evidence of right lumbosacral radiculopathy.    ------------------------------- Levert Feinstein, M.D. PhD  [facility] [address] [place], [place] [address] Tel: [phone] Fax: [phone]  Verbal informed consent was obtained from the patient, patient was informed of potential risk of procedure, including bruising, bleeding, hematoma formation, infection, muscle weakness, muscle pain, numbness, among others.         MNC    Nerve / Sites Muscle Latency Ref. Amplitude Ref. Rel Amp Segments Distance Velocity Ref. Area    ms ms mV mV %  cm m/s m/s mVms  R Peroneal - EDB     Ankle EDB 4.4 ?6.5 5.8 ?2.0 100 Ankle - EDB 9   20.3     Fib head EDB 9.1  5.5  94.8 Fib head - Ankle 24 50 ?44 19.7     Pop fossa EDB 11.1  4.7  85.8 Pop fossa - Fib head 10 51 ?44 17.8         Pop fossa - Ankle      L Peroneal - EDB     Ankle EDB 4.5 ?6.5 3.9 ?2.0 100 Ankle - EDB 9   15.4     Fib head EDB 9.7  3.6  92.3 Fib head - Ankle 24 46 ?44 15.2     Pop fossa EDB 11.8  3.5  98.5 Pop fossa - Fib head 10 48 ?44 15.2         Pop fossa - Ankle      R Tibial - AH     Ankle AH 4.0  ?5.8 7.0 ?4.0 100 Ankle - AH 9   13.5     Pop fossa AH 11.5  4.8  69.4 Pop fossa - Ankle 31 41 ?41 9.8  L Tibial - AH     Ankle AH 3.0 ?5.8 4.7 ?4.0 100 Ankle - AH 9   11.8     Pop fossa AH 12.0  3.7  77.8 Pop fossa - Ankle 32 36 ?41 7.5             SNC    Nerve / Sites Rec. Site Peak Lat Ref.  Amp Ref. Segments Distance    ms ms V V  cm  R Sural - Ankle (Calf)     Calf Ankle 4.1 ?4.4 11 ?6 Calf - Ankle 14  L Sural - Ankle (Calf)     Calf Ankle 4.0 ?4.4 6 ?6 Calf - Ankle 14  R Superficial peroneal - Ankle     Lat leg Ankle 3.9 ?4.4 2 ?6 Lat leg - Ankle 14  L Superficial peroneal - Ankle     Lat leg Ankle 3.9 ?4.4 3 ?6 Lat leg - Ankle 14  F  Wave    Nerve F Lat Ref.   ms ms  R Tibial - AH 49.8 ?56.0  L Tibial - AH 49.5 ?56.0         EMG Summary Table    Spontaneous MUAP Recruitment  Muscle IA Fib PSW Fasc Other Amp Dur. Poly Pattern  R. Tibialis anterior Normal None None None _______ Normal Normal Normal Normal  R. Tibialis posterior Normal None None None _______ Normal Normal Normal Normal  R. Peroneus longus Normal None None None _______ Normal Normal Normal Normal  R. Vastus lateralis Normal None None None _______ Normal Normal Normal Normal  R. Gastrocnemius (Medial head) Normal None None None _______ Normal Normal Normal Normal  R. Abductor hallucis Normal None None None _______ Normal Normal Normal Normal

## 2019-10-20 ENCOUNTER — Telehealth: Payer: Self-pay | Admitting: Neurology

## 2019-10-20 NOTE — Telephone Encounter (Signed)
Traci Strong: [id number] (exp. 10/20/19 to 11/19/19) order sent to GI. They will reach out to the patient to schedule.

## 2019-10-21 ENCOUNTER — Other Ambulatory Visit: Payer: Self-pay | Admitting: Neurology

## 2019-10-21 MED ORDER — ALPRAZOLAM 1 MG PO TABS: ORAL_TABLET | ORAL | 0 refills | Status: DC

## 2019-10-21 NOTE — Telephone Encounter (Signed)
I called the patient back and confirmed her only allergy is codeine. I reviewed the Xanax protocol with her and she verbalized understanding that she must have a driver. The prescription has been sent to Dr. Terrace Arabia for approval.

## 2019-10-21 NOTE — Telephone Encounter (Signed)
Pt called stating that she has scheduled her MRI appt and is very claustrophobic and is needing something prescribed to her to keep her calm and have it sent in to the [facility]

## 2019-10-22 LAB — MULTIPLE MYELOMA PANEL, SERUM
Albumin SerPl Elph-Mcnc: 3.5 g/dL (ref 2.9–4.4)
Albumin/Glob SerPl: 1.2 (ref 0.7–1.7)
Alpha 1: 0.3 g/dL (ref 0.0–0.4)
Alpha2 Glob SerPl Elph-Mcnc: 0.9 g/dL (ref 0.4–1.0)
B-Globulin SerPl Elph-Mcnc: 1.2 g/dL (ref 0.7–1.3)
Gamma Glob SerPl Elph-Mcnc: 0.6 g/dL (ref 0.4–1.8)
Globulin, Total: 3 g/dL (ref 2.2–3.9)
IgA/Immunoglobulin A, Serum: 154 mg/dL (ref 87–352)
IgG (Immunoglobin G), Serum: 652 mg/dL (ref 586–1602)
IgM (Immunoglobulin M), Srm: 50 mg/dL (ref 26–217)
Total Protein: 6.5 g/dL (ref 6.0–8.5)

## 2019-10-22 LAB — ANA W/REFLEX IF POSITIVE: Anti Nuclear Antibody (ANA): NEGATIVE

## 2019-10-22 LAB — VITAMIN D 25 HYDROXY (VIT D DEFICIENCY, FRACTURES): Vit D, 25-Hydroxy: 20.3 ng/mL — ABNORMAL LOW (ref 30.0–100.0)

## 2019-10-22 LAB — CK: Total CK: 50 U/L (ref 32–182)

## 2019-10-22 LAB — C-REACTIVE PROTEIN: CRP: 3 mg/L (ref 0–10)

## 2019-10-22 LAB — SEDIMENTATION RATE: Sed Rate: 3 mm/hr (ref 0–40)

## 2019-10-22 LAB — RPR: RPR Ser Ql: NONREACTIVE

## 2019-10-22 LAB — HGB A1C W/O EAG: Hgb A1c MFr Bld: 5.3 % (ref 4.8–5.6)

## 2019-10-22 LAB — VITAMIN B12: Vitamin B-12: 407 pg/mL (ref 232–1245)

## 2019-10-22 LAB — B. BURGDORFI ANTIBODIES: Lyme IgG/IgM Ab: <0.91 {ISR} (ref 0.00–0.90)

## 2019-10-22 LAB — FOLATE: Folate: 19.6 ng/mL (ref >3.0)

## 2019-10-23 ENCOUNTER — Ambulatory Visit (INDEPENDENT_AMBULATORY_CARE_PROVIDER_SITE_OTHER): Payer: Medicare PPO | Admitting: Psychology

## 2019-10-23 DIAGNOSIS — F4323 Adjustment disorder with mixed anxiety and depressed mood: Secondary | ICD-10-CM

## 2019-10-30 ENCOUNTER — Other Ambulatory Visit: Payer: Self-pay

## 2019-11-15 ENCOUNTER — Ambulatory Visit
Admission: RE | Admit: 2019-11-15 | Discharge: 2019-11-15 | Disposition: A | Discharge status: Home / Self Care | Payer: Medicare PPO | Source: Ambulatory Visit | Attending: Neurology | Admitting: Neurology

## 2019-11-15 DIAGNOSIS — R202 Paresthesia of skin: Secondary | ICD-10-CM

## 2019-11-16 ENCOUNTER — Telehealth: Payer: Self-pay | Admitting: Neurology

## 2019-11-16 NOTE — Telephone Encounter (Signed)
IMPRESSION:   MRI lumbar spine (without) demonstrating: - Facet hypertrophy at L4-5 and L5-S1. No spinal stenosis or foraminal narrowing.  - Bilateral renal cysts and right renal atrophy.  Please call patient, MRI of lumbar spine showed mild degenerative changes, no evidence of spinal cord or spinal nerve compression, incidental noted bilateral renal cysts, right renal atrophy, I have forward the MRI report to her primary care physician, Carylon Perches, MD, she may contact him for further evaluation about her incidental renal findings.

## 2019-11-16 NOTE — Telephone Encounter (Signed)
I spoke to the patient and she verbalized understanding of the MRI lumbar findings. She will contact DR. Fagan's office and schedule an appt to follow up on these results.

## 2019-11-17 ENCOUNTER — Ambulatory Visit (INDEPENDENT_AMBULATORY_CARE_PROVIDER_SITE_OTHER): Payer: Medicare PPO | Admitting: Psychology

## 2019-11-17 DIAGNOSIS — F4323 Adjustment disorder with mixed anxiety and depressed mood: Secondary | ICD-10-CM

## 2019-12-09 ENCOUNTER — Ambulatory Visit
Admission: RE | Admit: 2019-12-09 | Discharge: 2019-12-09 | Disposition: A | Discharge status: Home / Self Care | Payer: Medicare PPO | Source: Ambulatory Visit | Attending: Obstetrics & Gynecology | Admitting: Obstetrics & Gynecology

## 2019-12-09 ENCOUNTER — Ambulatory Visit: Payer: Medicare PPO | Admitting: Obstetrics & Gynecology

## 2019-12-09 ENCOUNTER — Encounter: Payer: Self-pay | Admitting: Obstetrics & Gynecology

## 2019-12-09 ENCOUNTER — Other Ambulatory Visit: Payer: Self-pay

## 2019-12-09 VITALS — BP 130/80 | Ht 60.0 in | Wt 162.3 lb

## 2019-12-09 DIAGNOSIS — Z01419 Encounter for gynecological examination (general) (routine) without abnormal findings: Secondary | ICD-10-CM | POA: Diagnosis not present

## 2019-12-09 DIAGNOSIS — Z6831 Body mass index (BMI) 31.0-31.9, adult: Secondary | ICD-10-CM

## 2019-12-09 DIAGNOSIS — E6609 Other obesity due to excess calories: Secondary | ICD-10-CM

## 2019-12-09 DIAGNOSIS — E559 Vitamin D deficiency, unspecified: Secondary | ICD-10-CM

## 2019-12-09 DIAGNOSIS — Z1231 Encounter for screening mammogram for malignant neoplasm of breast: Secondary | ICD-10-CM

## 2019-12-09 DIAGNOSIS — Z780 Asymptomatic menopausal state: Secondary | ICD-10-CM

## 2019-12-09 DIAGNOSIS — M810 Age-related osteoporosis without current pathological fracture: Secondary | ICD-10-CM

## 2019-12-09 NOTE — Progress Notes (Signed)
Traci Strong 02-Aug-1955 [id number]   History:    64 y.o. G1P1L1 Divorced.  Stable companion passed away this year.  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  RP: Established patient presenting for annual gyn exam   HPI: Postmenopause, well on no HRT.  No PMB.  No pelvic pain.  Abstinent.  Urine/BMs normal.  Breasts normal.  BMI increased to 31.7.  Grieving her companion's demise.  Not currently physically active.  Needs to investigate MRI kidney findings of Bilateral Cysts/Rt Kidney atrophy, will contact her Fam MD now.  Health labs with Fam MD.  Past medical history,surgical history, family history and social history were all reviewed and documented in the EPIC chart.  Gynecologic History No LMP recorded. Patient is postmenopausal.  Obstetric History OB History  Gravida Para Term Preterm AB Living  1 1       1   SAB TAB Ectopic Multiple Live Births               # Outcome Date GA Lbr Len/2nd Weight Sex Delivery Anes PTL Lv  1 Para              ROS: A ROS was performed and pertinent positives and negatives are included in the history.  GENERAL: No fevers or chills. HEENT: No change in vision, no earache, sore throat or sinus congestion. NECK: No pain or stiffness. CARDIOVASCULAR: No chest pain or pressure. No palpitations. PULMONARY: No shortness of breath, cough or wheeze. GASTROINTESTINAL: No abdominal pain, nausea, vomiting or diarrhea, melena or bright red blood per rectum. GENITOURINARY: No urinary frequency, urgency, hesitancy or dysuria. MUSCULOSKELETAL: No joint or muscle pain, no back pain, no recent trauma. DERMATOLOGIC: No rash, no itching, no lesions. ENDOCRINE: No polyuria, polydipsia, no heat or cold intolerance. No recent change in weight. HEMATOLOGICAL: No anemia or easy bruising or bleeding. NEUROLOGIC: No headache, seizures, numbness, tingling or weakness. PSYCHIATRIC: No depression, no loss of interest in normal activity or change in sleep pattern.       Exam:   BP 130/80   Ht 5' (1.524 m)   Wt 162 lb 4.8 oz (73.6 kg)   BMI 31.70 kg/m   Body mass index is 31.7 kg/m.  General appearance : Well developed well nourished female. No acute distress HEENT: Eyes: no retinal hemorrhage or exudates,  Neck supple, trachea midline, no carotid bruits, no thyroidmegaly Lungs: Clear to auscultation, no rhonchi or wheezes, or rib retractions  Heart: Regular rate and rhythm, no murmurs or gallops Breast:Examined in sitting and supine position were symmetrical in appearance, no palpable masses or tenderness,  no skin retraction, no nipple inversion, no nipple discharge, no skin discoloration, no axillary or supraclavicular lymphadenopathy Abdomen: no palpable masses or tenderness, no rebound or guarding Extremities: no edema or skin discoloration or tenderness  Pelvic: Vulva: Normal             Vagina: No gross lesions or discharge  Cervix: No gross lesions or discharge.  Pap reflex done.  Uterus  AV, normal size, shape and consistency, non-tender and mobile  Adnexa  Without masses or tenderness  Anus: Normal   Assessment/Plan:  64 y.o. female for annual exam   1. Encounter for routine gynecological examination with Papanicolaou smear of cervix Normal gynecologic exam.  Pap reflex done.  Breast exam normal.  Screening mammogram August 2021 was negative.  Cologuard done December 2020.  Health labs with family physician.  Will investigate the renal findings on a recent  MRI through her family physician.  Bilateral renal cysts and atrophy of the right kidney were briefly described.  2. Postmenopause Well on no hormone replacement therapy.  No postmenopausal bleeding.  3. Age-related osteoporosis without current pathological fracture Bone density June 2019 showed osteoporosis with a T score of -2.7 in the lumbar vertebrae.  Patient is on Actonel.  Needs to repeat a bone density now.  Continue on Actonel and vitamin D supplements, calcium intake of  1200 to 1500 mg daily and regular weightbearing physical activities. - DG Bone Density; Future  4. Class 1 obesity due to excess calories without serious comorbidity with body mass index (BMI) of 31.0 to 31.9 in adult Recommend a lower calorie/carb diet such as [employer].  Aerobic activities 5 times a week and light weightlifting every 2 days.  Other orders - risedronate (ACTONEL) 150 MG tablet; Take 1 tablet (150 mg total) by mouth every 30 (thirty) days. with water on empty stomach, nothing by mouth or lie down for next 30 minutes.  Genia Del MD, 9:35 AM 12/09/2019

## 2019-12-10 MED ORDER — VITAMIN D (ERGOCALCIFEROL) 1.25 MG (50000 UNIT) PO CAPS: 50000.0000 [IU] | ORAL_CAPSULE | ORAL | 0 refills | Status: DC

## 2019-12-10 NOTE — Telephone Encounter (Signed)
Another provider had ordered this test. He wrote to her:  "The only abnormality on extensive laboratory evaluation is mildly decreased vitamin D level, it was 20.3, with normal being 30 NG/mL and above. You would benefit over-the-counter vitamin D3 supplement, 1000 units daily.  The rest of the laboratory evaluations were normal.  Levert Feinstein, M.D. Ph.D."

## 2019-12-11 LAB — PAP IG W/ RFLX HPV ASCU

## 2019-12-12 ENCOUNTER — Encounter: Payer: Self-pay | Admitting: Obstetrics & Gynecology

## 2019-12-12 MED ORDER — RISEDRONATE SODIUM 150 MG PO TABS: 150.0000 mg | ORAL_TABLET | ORAL | 4 refills | Status: DC

## 2019-12-16 ENCOUNTER — Ambulatory Visit (INDEPENDENT_AMBULATORY_CARE_PROVIDER_SITE_OTHER): Payer: Medicare PPO | Admitting: Psychology

## 2019-12-16 DIAGNOSIS — F4323 Adjustment disorder with mixed anxiety and depressed mood: Secondary | ICD-10-CM

## 2020-01-13 ENCOUNTER — Ambulatory Visit (INDEPENDENT_AMBULATORY_CARE_PROVIDER_SITE_OTHER): Payer: Medicare PPO | Admitting: Psychology

## 2020-01-13 DIAGNOSIS — F4323 Adjustment disorder with mixed anxiety and depressed mood: Secondary | ICD-10-CM | POA: Diagnosis not present

## 2020-01-19 ENCOUNTER — Ambulatory Visit (INDEPENDENT_AMBULATORY_CARE_PROVIDER_SITE_OTHER): Payer: Medicare PPO

## 2020-01-19 ENCOUNTER — Other Ambulatory Visit: Payer: Self-pay | Admitting: Obstetrics & Gynecology

## 2020-01-19 ENCOUNTER — Other Ambulatory Visit: Payer: Self-pay

## 2020-01-19 DIAGNOSIS — M810 Age-related osteoporosis without current pathological fracture: Secondary | ICD-10-CM

## 2020-01-19 DIAGNOSIS — Z780 Asymptomatic menopausal state: Secondary | ICD-10-CM | POA: Diagnosis not present

## 2020-02-15 ENCOUNTER — Ambulatory Visit (INDEPENDENT_AMBULATORY_CARE_PROVIDER_SITE_OTHER): Payer: Medicare PPO | Admitting: Psychology

## 2020-02-15 DIAGNOSIS — F4323 Adjustment disorder with mixed anxiety and depressed mood: Secondary | ICD-10-CM | POA: Diagnosis not present

## 2020-03-15 ENCOUNTER — Ambulatory Visit (INDEPENDENT_AMBULATORY_CARE_PROVIDER_SITE_OTHER): Payer: Medicare PPO | Admitting: Psychology

## 2020-03-15 DIAGNOSIS — F4323 Adjustment disorder with mixed anxiety and depressed mood: Secondary | ICD-10-CM | POA: Diagnosis not present

## 2020-03-28 DIAGNOSIS — M810 Age-related osteoporosis without current pathological fracture: Secondary | ICD-10-CM | POA: Diagnosis not present

## 2020-03-28 DIAGNOSIS — G43909 Migraine, unspecified, not intractable, without status migrainosus: Secondary | ICD-10-CM | POA: Diagnosis not present

## 2020-03-28 DIAGNOSIS — G9009 Other idiopathic peripheral autonomic neuropathy: Secondary | ICD-10-CM | POA: Diagnosis not present

## 2020-03-28 DIAGNOSIS — Z79899 Other long term (current) drug therapy: Secondary | ICD-10-CM | POA: Diagnosis not present

## 2020-03-28 DIAGNOSIS — I10 Essential (primary) hypertension: Secondary | ICD-10-CM | POA: Diagnosis not present

## 2020-04-05 DIAGNOSIS — N1831 Chronic kidney disease, stage 3a: Secondary | ICD-10-CM | POA: Diagnosis not present

## 2020-04-05 DIAGNOSIS — I10 Essential (primary) hypertension: Secondary | ICD-10-CM | POA: Diagnosis not present

## 2020-04-05 DIAGNOSIS — E785 Hyperlipidemia, unspecified: Secondary | ICD-10-CM | POA: Diagnosis not present

## 2020-04-07 ENCOUNTER — Ambulatory Visit: Payer: Medicare PPO | Admitting: Neurology

## 2020-04-12 ENCOUNTER — Ambulatory Visit: Payer: Medicare PPO | Admitting: Neurology

## 2020-04-14 ENCOUNTER — Ambulatory Visit: Payer: Medicare PPO | Admitting: Neurology

## 2020-04-14 ENCOUNTER — Ambulatory Visit (INDEPENDENT_AMBULATORY_CARE_PROVIDER_SITE_OTHER): Payer: Medicare PPO | Admitting: Psychology

## 2020-04-14 DIAGNOSIS — F4323 Adjustment disorder with mixed anxiety and depressed mood: Secondary | ICD-10-CM | POA: Diagnosis not present

## 2020-04-21 ENCOUNTER — Encounter: Payer: Self-pay | Admitting: Neurology

## 2020-04-21 ENCOUNTER — Ambulatory Visit: Payer: Medicare PPO | Admitting: Neurology

## 2020-04-21 VITALS — BP 149/82 | HR 59 | Ht 60.0 in | Wt 162.0 lb

## 2020-04-21 DIAGNOSIS — G609 Hereditary and idiopathic neuropathy, unspecified: Secondary | ICD-10-CM | POA: Diagnosis not present

## 2020-04-21 DIAGNOSIS — R202 Paresthesia of skin: Secondary | ICD-10-CM | POA: Diagnosis not present

## 2020-04-21 NOTE — Progress Notes (Signed)
PATIENT: Traci Strong DOB: [birth year]  Chief Complaint  Patient presents with  . Numbness    The numbness is still present on the bottom of all ten toes.     HISTORICAL  Traci Strong is a 64 year old female, seen in request by her primary care doctor Asencion Noble for evaluation of bilateral feet paresthesia, initial evaluation was on October 15, 2019.  I reviewed and summarized the referring note.  She has past medical history of hypertension, well controlled by low-dose Cozaar 100 mg daily Osteoporosis, taking Actonel 150 mg every 30 days Chronic migraine headaches, about once a month, responding well to Imitrex 50 mg as needed  Since 2016, she noticed bilateral toes, plantar surface numbness tingling, fairly symmetric, gradual onset, slowly progressive, is most noticeable at nighttime when she is trying to going to sleep, she noticed numbness tingling, but no burning pain, less noticeable during daytime  She denies gait abnormality, denies bowel and bladder incontinence, denies bilateral fingertips paresthesia,  She does complains of occasionally low back pain, radiating pain to left lower extremity,  I reviewed laboratory evaluation in December 2020, CMP showed elevated glucose 104, creatinine 1.1, CBC was normal, hemoglobin of 15.4, normal TSH,  Update April 21, 2020: She is overall doing well, continue has toes and bottom of her feet numbness, but denies pain, denies gait abnormality  Personally reviewed MRI of lumbar spine, mild degenerative changes no significant canal or foraminal narrowing Laboratory evaluations showed mildly decreased vitamin D 20, otherwise normal and negative B12, Lyme titer, ANA, protein electrophoresis, A1c, ESR, C-reactive protein, CPK, folic acid, RPR  EMG nerve conduction study in June 2021 showed mild length dependent axonal sensory predominant neuropathy,   REVIEW OF SYSTEMS: Full 14 system review of systems performed and notable only for as  above All other review of systems were negative.  ALLERGIES: Allergies  Allergen Reactions  . Codeine Other (See Comments)    Increase heart rate    HOME MEDICATIONS: Current Outpatient Medications  Medication Sig Dispense Refill  . losartan (COZAAR) 100 MG tablet Take 100 mg by mouth daily.    Marland Kitchen NADOLOL PO Take 120 mg by mouth.    . risedronate (ACTONEL) 150 MG tablet Take 1 tablet (150 mg total) by mouth every 30 (thirty) days. with water on empty stomach, nothing by mouth or lie down for next 30 minutes. 3 tablet 4  . SUMAtriptan (IMITREX) 50 MG tablet Take 50 mg by mouth every 2 (two) hours as needed for migraine. May repeat in 2 hours if headache persists or recurs.     No current facility-administered medications for this visit.    PAST MEDICAL HISTORY: Past Medical History:  Diagnosis Date  . Hypertension   . Migraines   . Numbness   . Osteoporosis 10/2017   T score -2.7    PAST SURGICAL HISTORY: Past Surgical History:  Procedure Laterality Date  . TOOTH EXTRACTION    . WISDOM TOOTH EXTRACTION      FAMILY HISTORY: Family History  Problem Relation Age of Onset  . Hypertension Mother   . Heart attack Mother   . Parkinson's disease Mother   . Cancer Father        prostate  . Hypertension Father   . Aneurysm Father   . Cancer Brother        thyroid  . Hypertension Brother   . Diabetes Maternal Grandmother   . Heart attack Maternal Grandmother   . Breast cancer Paternal  Grandmother   . Heart attack Paternal Grandmother   . Hypertension Daughter     SOCIAL HISTORY: Social History   Socioeconomic History  . Marital status: Divorced    Spouse name: Not on file  . Number of children: 1  . Years of education: college  . Highest education level: Master's degree (e.g., MA, MS, MEng, MEd, MSW, MBA)  Occupational History  . Occupation: Retired  Tobacco Use  . Smoking status: Never Smoker  . Smokeless tobacco: Never Used  Vaping Use  . Vaping Use:  Never used  Substance and Sexual Activity  . Alcohol use: No  . Drug use: No  . Sexual activity: Not Currently    Comment: 1st intercourse- 19, partners- 3,   Other Topics Concern  . Not on file  Social History Narrative   Lives alone.   Right-handed.   1-2 cups caffeine per day.   Social Determinants of Health   Financial Resource Strain: Not on file  Food Insecurity: Not on file  Transportation Needs: Not on file  Physical Activity: Not on file  Stress: Not on file  Social Connections: Not on file  Intimate Partner Violence: Not on file     PHYSICAL EXAM   Vitals:   04/21/20 0731  BP: (!) 149/82  Pulse: (!) 59  Weight: 162 lb (73.5 kg)  Height: 5' (1.524 m)   Not recorded     Body mass index is 31.64 kg/m.  PHYSICAL EXAMNIATION:  Gen: NAD, conversant, well nourised, well groomed          NEUROLOGICAL EXAM:  MENTAL STATUS: Speech/cognition  awake alert oriented to history taking and casual conversation:  CRANIAL NERVES: CN II: Visual fields are full to confrontation. Pupils are round equal and briskly reactive to light. CN III, IV, VI: extraocular movement are normal. No ptosis. CN V: Facial sensation is intact to light touch CN VII: Face is symmetric with normal eye closure  CN VIII: Hearing is normal to causal conversation. CN IX, X: Phonation is normal. CN XI: Head turning and shoulder shrug are intact  MOTOR: There is no pronator drift of out-stretched arms. Muscle bulk and tone are normal. Muscle strength is normal.  REFLEXES: Reflexes are 1 and symmetric at the biceps, triceps, knees, and absent at ankles. Plantar responses are flexor.  SENSORY: Decreased vibratory sensation at toes, length dependent decreased light touch pinprick to distal shin level  COORDINATION: There is no trunk or limb dysmetria noted.  GAIT/STANCE: Posture is normal. Gait is steady with normal steps, base, arm swing, and turning. Heel and toe walking are normal.  Tandem gait is normal.  Romberg is absent.   DIAGNOSTIC DATA (LABS, IMAGING, TESTING) - I reviewed patient records, labs, notes, testing and imaging myself where available.   ASSESSMENT AND PLAN  Traci Strong is a 64 y.o. 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 Ph.D.  [facility] [address] [place], [place] [address] Ph: [phone] Fax: [phone]  CC: Asencion Noble, MD  Referring Physician

## 2020-05-13 ENCOUNTER — Ambulatory Visit (INDEPENDENT_AMBULATORY_CARE_PROVIDER_SITE_OTHER): Payer: Medicare PPO | Admitting: Psychology

## 2020-05-13 DIAGNOSIS — F4323 Adjustment disorder with mixed anxiety and depressed mood: Secondary | ICD-10-CM

## 2020-06-10 ENCOUNTER — Ambulatory Visit (INDEPENDENT_AMBULATORY_CARE_PROVIDER_SITE_OTHER): Payer: Medicare PPO | Admitting: Psychology

## 2020-06-10 DIAGNOSIS — F4323 Adjustment disorder with mixed anxiety and depressed mood: Secondary | ICD-10-CM | POA: Diagnosis not present

## 2020-07-08 ENCOUNTER — Ambulatory Visit (INDEPENDENT_AMBULATORY_CARE_PROVIDER_SITE_OTHER): Payer: Medicare PPO | Admitting: Psychology

## 2020-07-08 DIAGNOSIS — F4323 Adjustment disorder with mixed anxiety and depressed mood: Secondary | ICD-10-CM

## 2020-07-14 DIAGNOSIS — H52203 Unspecified astigmatism, bilateral: Secondary | ICD-10-CM | POA: Diagnosis not present

## 2020-07-14 DIAGNOSIS — H25813 Combined forms of age-related cataract, bilateral: Secondary | ICD-10-CM | POA: Diagnosis not present

## 2020-07-14 DIAGNOSIS — H524 Presbyopia: Secondary | ICD-10-CM | POA: Diagnosis not present

## 2020-08-05 ENCOUNTER — Ambulatory Visit (INDEPENDENT_AMBULATORY_CARE_PROVIDER_SITE_OTHER): Payer: Medicare PPO | Admitting: Psychology

## 2020-08-05 DIAGNOSIS — F4323 Adjustment disorder with mixed anxiety and depressed mood: Secondary | ICD-10-CM | POA: Diagnosis not present

## 2020-09-02 ENCOUNTER — Ambulatory Visit (INDEPENDENT_AMBULATORY_CARE_PROVIDER_SITE_OTHER): Payer: Medicare PPO | Admitting: Psychology

## 2020-09-02 DIAGNOSIS — F4323 Adjustment disorder with mixed anxiety and depressed mood: Secondary | ICD-10-CM

## 2020-09-29 ENCOUNTER — Ambulatory Visit (INDEPENDENT_AMBULATORY_CARE_PROVIDER_SITE_OTHER): Payer: Medicare PPO | Admitting: Psychology

## 2020-09-29 DIAGNOSIS — F4323 Adjustment disorder with mixed anxiety and depressed mood: Secondary | ICD-10-CM | POA: Diagnosis not present

## 2020-10-06 ENCOUNTER — Other Ambulatory Visit: Payer: Self-pay | Admitting: Obstetrics & Gynecology

## 2020-10-06 DIAGNOSIS — Z1231 Encounter for screening mammogram for malignant neoplasm of breast: Secondary | ICD-10-CM

## 2020-10-07 DIAGNOSIS — I10 Essential (primary) hypertension: Secondary | ICD-10-CM | POA: Diagnosis not present

## 2020-10-07 DIAGNOSIS — G43909 Migraine, unspecified, not intractable, without status migrainosus: Secondary | ICD-10-CM | POA: Diagnosis not present

## 2020-11-04 ENCOUNTER — Ambulatory Visit (INDEPENDENT_AMBULATORY_CARE_PROVIDER_SITE_OTHER): Payer: Medicare PPO | Admitting: Psychology

## 2020-11-04 DIAGNOSIS — F4323 Adjustment disorder with mixed anxiety and depressed mood: Secondary | ICD-10-CM | POA: Diagnosis not present

## 2020-11-21 DIAGNOSIS — M546 Pain in thoracic spine: Secondary | ICD-10-CM | POA: Diagnosis not present

## 2020-11-21 DIAGNOSIS — M9901 Segmental and somatic dysfunction of cervical region: Secondary | ICD-10-CM | POA: Diagnosis not present

## 2020-11-21 DIAGNOSIS — M542 Cervicalgia: Secondary | ICD-10-CM | POA: Diagnosis not present

## 2020-11-21 DIAGNOSIS — M9902 Segmental and somatic dysfunction of thoracic region: Secondary | ICD-10-CM | POA: Diagnosis not present

## 2020-12-01 ENCOUNTER — Ambulatory Visit (INDEPENDENT_AMBULATORY_CARE_PROVIDER_SITE_OTHER): Payer: Medicare PPO | Admitting: Psychology

## 2020-12-01 DIAGNOSIS — F4323 Adjustment disorder with mixed anxiety and depressed mood: Secondary | ICD-10-CM | POA: Diagnosis not present

## 2020-12-09 ENCOUNTER — Ambulatory Visit: Payer: Medicare PPO | Admitting: Obstetrics & Gynecology

## 2020-12-19 ENCOUNTER — Ambulatory Visit
Admission: RE | Admit: 2020-12-19 | Discharge: 2020-12-19 | Disposition: A | Discharge status: Home / Self Care | Payer: Medicare PPO | Source: Ambulatory Visit | Attending: Obstetrics & Gynecology | Admitting: Obstetrics & Gynecology

## 2020-12-19 ENCOUNTER — Other Ambulatory Visit: Payer: Self-pay

## 2020-12-19 DIAGNOSIS — Z1231 Encounter for screening mammogram for malignant neoplasm of breast: Secondary | ICD-10-CM | POA: Diagnosis not present

## 2020-12-29 ENCOUNTER — Ambulatory Visit (INDEPENDENT_AMBULATORY_CARE_PROVIDER_SITE_OTHER): Payer: Medicare PPO | Admitting: Psychology

## 2020-12-29 DIAGNOSIS — F4323 Adjustment disorder with mixed anxiety and depressed mood: Secondary | ICD-10-CM

## 2021-01-13 DIAGNOSIS — M9901 Segmental and somatic dysfunction of cervical region: Secondary | ICD-10-CM | POA: Diagnosis not present

## 2021-01-13 DIAGNOSIS — M9902 Segmental and somatic dysfunction of thoracic region: Secondary | ICD-10-CM | POA: Diagnosis not present

## 2021-01-13 DIAGNOSIS — M542 Cervicalgia: Secondary | ICD-10-CM | POA: Diagnosis not present

## 2021-01-13 DIAGNOSIS — M546 Pain in thoracic spine: Secondary | ICD-10-CM | POA: Diagnosis not present

## 2021-02-08 ENCOUNTER — Ambulatory Visit (INDEPENDENT_AMBULATORY_CARE_PROVIDER_SITE_OTHER): Payer: Medicare PPO | Admitting: Psychology

## 2021-02-08 DIAGNOSIS — F4323 Adjustment disorder with mixed anxiety and depressed mood: Secondary | ICD-10-CM | POA: Diagnosis not present

## 2021-03-15 ENCOUNTER — Ambulatory Visit: Payer: Medicare PPO | Admitting: Psychology

## 2021-03-20 ENCOUNTER — Ambulatory Visit: Payer: Medicare PPO | Admitting: Psychology

## 2021-03-21 ENCOUNTER — Ambulatory Visit: Payer: Medicare PPO | Admitting: Obstetrics & Gynecology

## 2021-03-24 ENCOUNTER — Other Ambulatory Visit: Payer: Self-pay | Admitting: Obstetrics & Gynecology

## 2021-03-27 ENCOUNTER — Telehealth: Payer: Self-pay | Admitting: *Deleted

## 2021-03-27 NOTE — Telephone Encounter (Signed)
Patient called stating medicare reports they will only cover breast and pelvic every 2 years last annual exam was 11/2019.  I want to confirm you are okay with this? She had recall in for breast and pelvic this year.  She is requesting refill on actonel 150 mg tablet,okay to send?

## 2021-03-28 MED ORDER — RISEDRONATE SODIUM 150 MG PO TABS: 150.0000 mg | ORAL_TABLET | ORAL | 0 refills | Status: DC

## 2021-03-28 NOTE — Telephone Encounter (Signed)
Annual exam scheduled on 06/27/20

## 2021-03-28 NOTE — Telephone Encounter (Signed)
I spoke with patient and she is aware of the medicare wavier form to sign and she would like to schedule annual exam. Transferred to appointments to schedule. Once scheduled I will send in Rx for Actonel 150 mg.

## 2021-04-03 DIAGNOSIS — M109 Gout, unspecified: Secondary | ICD-10-CM | POA: Diagnosis not present

## 2021-04-03 DIAGNOSIS — G43909 Migraine, unspecified, not intractable, without status migrainosus: Secondary | ICD-10-CM | POA: Diagnosis not present

## 2021-04-03 DIAGNOSIS — N183 Chronic kidney disease, stage 3 unspecified: Secondary | ICD-10-CM | POA: Diagnosis not present

## 2021-04-03 DIAGNOSIS — M810 Age-related osteoporosis without current pathological fracture: Secondary | ICD-10-CM | POA: Diagnosis not present

## 2021-04-03 DIAGNOSIS — I10 Essential (primary) hypertension: Secondary | ICD-10-CM | POA: Diagnosis not present

## 2021-04-03 DIAGNOSIS — Z79899 Other long term (current) drug therapy: Secondary | ICD-10-CM | POA: Diagnosis not present

## 2021-04-10 DIAGNOSIS — Z23 Encounter for immunization: Secondary | ICD-10-CM | POA: Diagnosis not present

## 2021-04-10 DIAGNOSIS — N1831 Chronic kidney disease, stage 3a: Secondary | ICD-10-CM | POA: Diagnosis not present

## 2021-04-10 DIAGNOSIS — I10 Essential (primary) hypertension: Secondary | ICD-10-CM | POA: Diagnosis not present

## 2021-04-10 DIAGNOSIS — G43909 Migraine, unspecified, not intractable, without status migrainosus: Secondary | ICD-10-CM | POA: Diagnosis not present

## 2021-05-01 ENCOUNTER — Ambulatory Visit (INDEPENDENT_AMBULATORY_CARE_PROVIDER_SITE_OTHER): Payer: Medicare PPO | Admitting: Psychology

## 2021-05-01 DIAGNOSIS — F4323 Adjustment disorder with mixed anxiety and depressed mood: Secondary | ICD-10-CM | POA: Diagnosis not present

## 2021-05-01 NOTE — Progress Notes (Signed)
[facility] Counselor/Therapist Progress Note  Patient ID: Traci Strong, MRN: [id number]    Date: 05/01/21  Time Spent: 8:06  am - 8:49 am : 43 Minutes  Treatment Type: Individual Therapy.  Reported Symptoms: Anxiety.  Mental Status Exam: Appearance:  Neat and Well Groomed     Behavior: Appropriate  Motor: Normal  Speech/Language:  Clear and Coherent and Normal Rate  Affect: Congruent  Mood: normal  Thought process: normal  Thought content:   WNL  Sensory/Perceptual disturbances:   WNL  Orientation: oriented to person, place, time/date, and situation  Attention: Good  Concentration: Good  Memory: WNL  Fund of knowledge:  Good  Insight:   Good  Judgment:  Good  Impulse Control: Good   Risk Assessment: Danger to Self:  No Self-injurious Behavior: No Danger to Others: No Duty to Warn:no Physical Aggression / Violence:No  Access to Firearms a concern: No  Gang Involvement:No   Subjective:   Traci Strong participated from home, via video, and consented to treatment. Therapist participated from home office. We met online due to [place] pandemic. Traci Strong reviewed the events of the past week. Traci Strong noted her worry regarding her brother's decision-making and the possible effects on his generally daily functioning. She noted a general sense of worry regarding decisions that her brother makes when she is not around. She noted frustration with his decision making which she finds, at times, inexplicable. We explored this during the session and the effect of this on her overall mood. She noted a need for increased self-care. She discussed her goals in that regard, which were delineated during the session.  Therapist employed BA principles during the session. We reviewed principles of BA and noted the importance of preparing, scheduling, setting reasonable goals, and identifying possible barriers. Traci Strong was engaged and receptive. Therapist provided supportive therapy.    Interventions:  Behavioral Activation  Strong:  Adjustment disorder with mixed anxiety and depressed mood  Treatment Plan:  Client Abilities/Strengths Traci Strong is intelligent, forthcoming, and motivated for change.     Support System: Traci Strong.   Client Treatment Preferences Outpatient Therapy  Client Statement of Needs Traci Strong discussed her treatment goals which included consistently engaging in enjoyable activities and self-care. Additionally, she noted a need to bolstering her coping skills during stressful times.   Treatment Level Weekly  Symptoms Anxiety: Consistent worry (brother and health, COVID), muscle tension, irritability (improved), unrestful sleep (improved), restlessness (improved).   (Status: maintained) Depression: Fatigue (improved), & frequent sadness (improved), & grief.   (Status: maintained)  Goals:  Traci Strong experiences symptoms of depression and anxiety.   Target Date: 12/01/2021 Frequency: Weekly  Progress: 0 Modality: individual    Therapist will provide referrals for additional resources as appropriate.  Therapist will provide psycho-education regarding Traci Strong and corresponding treatment approaches and interventions. Licensed Clinical Social Worker, [place], LCSW will support the patient's ability to achieve the goals identified. will employ CBT, BA, Problem-solving, Solution Focused, Mindfulness,  coping skills, & other evidenced-based practices will be used to promote progress towards healthy functioning to help manage decrease symptoms associated with her Strong.   Reduce overall level, frequency, and intensity of the feelings of depression, anxiety and panic evidenced by decreased her overall symptoms from 6 to 7 days/week to 0 to 1 days/week per client report for at least 3 consecutive months. Verbally express understanding of the relationship between feelings of depression, anxiety and their impact on  thinking patterns and behaviors. Verbalize an understanding  of the role that distorted thinking plays in creating fears, excessive worry, and ruminations.  Traci Strong participated in the creation of the treatment plan)   [name], LCSW

## 2021-06-06 ENCOUNTER — Ambulatory Visit (INDEPENDENT_AMBULATORY_CARE_PROVIDER_SITE_OTHER): Payer: Medicare PPO | Admitting: Psychology

## 2021-06-06 DIAGNOSIS — F4323 Adjustment disorder with mixed anxiety and depressed mood: Secondary | ICD-10-CM

## 2021-06-06 NOTE — Progress Notes (Signed)
[facility] Counselor/Therapist Progress Note  Patient ID: Traci Strong, MRN: [id number]    Date: 06/06/21  Time Spent: 9:05  am - 10:01 am : 40 Minutes  Treatment Type: Individual Therapy.  Reported Symptoms: Anxiety.  Mental Status Exam: Appearance:  Neat and Well Groomed     Behavior: Appropriate  Motor: Normal  Speech/Language:  Clear and Coherent and Normal Rate  Affect: Congruent  Mood: normal  Thought process: normal  Thought content:   WNL  Sensory/Perceptual disturbances:   WNL  Orientation: oriented to person, place, time/date, and situation  Attention: Good  Concentration: Good  Memory: WNL  Fund of knowledge:  Good  Insight:   Good  Judgment:  Good  Impulse Control: Good   Risk Assessment: Danger to Self:  No Self-injurious Behavior: No Danger to Others: No Duty to Warn:no Physical Aggression / Violence:No  Access to Firearms a concern: No  Gang Involvement:No   Subjective:   Traci Strong participated from home, via video, and consented to treatment. Therapist participated from office. We met online due to Traci Strong pandemic. Traci Strong reviewed the events of the past week. Traci Strong noted increased stressors due to her brother's recent emergency hospitalization. Traci Strong reflected experiencing negative self-talk, during the session, regarding her brother being ill and feeling responsible. 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 She endorsed gastrointestinal issues as a result of her anxiety. We continued to process her grief, during the session, and feelings of guilt regarding the possibility of dating after Traci Strong's passing two years ago.  Therapist encouraged Traci Strong to work on identifying additional feelings regarding this, specific needs she might have, and what she is looking for in a relationship.  We worked on processing this during our follow-up.  Therapist encouraged  Traci Strong to set boundaries regarding this and to limit rumination regarding this going forward. Traci Strong was engaged and receptive. Therapist provided supportive therapy.   Interventions: Cognitive Behavioral Therapy and Grief Therapy  Diagnosis:  Adjustment disorder with mixed anxiety and depressed mood  Treatment Plan:  Client Abilities/Strengths Traci Strong is intelligent, forthcoming, and motivated for change.     Support System: Traci Strong has a supportive family.   Client Treatment Preferences Outpatient Therapy  Client Statement of Needs Traci Strong discussed her treatment goals which included consistently engaging in enjoyable activities and self-care. Additionally, she noted a need to bolstering her coping skills during stressful times.   Treatment Level Weekly  Symptoms Anxiety: Consistent worry (brother and health, COVID), muscle tension, irritability (improved), unrestful sleep (improved), restlessness (improved).   (Status: maintained) Depression: Fatigue (improved), & frequent sadness (improved), & grief.   (Status: maintained)  Goals:  Traci Strong experiences symptoms of depression and anxiety.   Target Date: 12/01/2021 Frequency: Weekly  Progress: 0 Modality: individual    Therapist will provide referrals for additional resources as appropriate.  Therapist will provide psycho-education regarding Traci Strong diagnosis and corresponding treatment approaches and interventions. Licensed Clinical Social Worker, [place], LCSW will support the patient's ability to achieve the goals identified. will employ CBT, BA, Problem-solving, Solution Focused, Mindfulness,  coping skills, & other evidenced-based practices will be used to promote progress towards healthy functioning to help manage decrease symptoms associated with her diagnosis.   Reduce overall level, frequency, and intensity of the feelings of depression, anxiety and panic evidenced by decreased her overall symptoms from 6 to 7 days/week  to 0 to 1 days/week per client report for  at least 3 consecutive months. Verbally express understanding of the relationship between feelings of depression, anxiety and their impact on thinking patterns and behaviors. Verbalize an understanding of the role that distorted thinking plays in creating fears, excessive worry, and ruminations.  Traci Strong participated in the creation of the treatment plan)  Traci Irish, LCSW

## 2021-06-27 ENCOUNTER — Ambulatory Visit (INDEPENDENT_AMBULATORY_CARE_PROVIDER_SITE_OTHER): Payer: Medicare PPO | Admitting: Obstetrics & Gynecology

## 2021-06-27 ENCOUNTER — Encounter: Payer: Self-pay | Admitting: Obstetrics & Gynecology

## 2021-06-27 ENCOUNTER — Other Ambulatory Visit: Payer: Self-pay

## 2021-06-27 VITALS — BP 140/80 | HR 72 | Resp 16 | Ht 59.75 in | Wt 159.0 lb

## 2021-06-27 DIAGNOSIS — Z780 Asymptomatic menopausal state: Secondary | ICD-10-CM

## 2021-06-27 DIAGNOSIS — M8589 Other specified disorders of bone density and structure, multiple sites: Secondary | ICD-10-CM | POA: Diagnosis not present

## 2021-06-27 DIAGNOSIS — Z01419 Encounter for gynecological examination (general) (routine) without abnormal findings: Secondary | ICD-10-CM | POA: Diagnosis not present

## 2021-06-27 DIAGNOSIS — M810 Age-related osteoporosis without current pathological fracture: Secondary | ICD-10-CM | POA: Diagnosis not present

## 2021-06-27 DIAGNOSIS — E6609 Other obesity due to excess calories: Secondary | ICD-10-CM | POA: Diagnosis not present

## 2021-06-27 DIAGNOSIS — Z6831 Body mass index (BMI) 31.0-31.9, adult: Secondary | ICD-10-CM | POA: Diagnosis not present

## 2021-06-27 DIAGNOSIS — Z9189 Other specified personal risk factors, not elsewhere classified: Secondary | ICD-10-CM | POA: Diagnosis not present

## 2021-06-27 MED ORDER — RISEDRONATE SODIUM 150 MG PO TABS: 150.0000 mg | ORAL_TABLET | ORAL | 4 refills | Status: DC

## 2021-06-27 NOTE — Progress Notes (Signed)
Traci Strong 1955/08/22 [id number]   History:    66 y.o. G1P1L1 Divorced.  Stable companion passed away last year.  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  Daughter had a baby in 03/2021, moved to [place] 2 wks ago.   RP: Established patient presenting for annual gyn exam    HPI: Postmenopause, well on no HRT.  No PMB.  No pelvic pain.  Abstinent.  Pap Neg 05/2020.  Urine/BMs normal.  Breasts normal.  Mammo Neg 11/2020. BMI 31.31. Walking.  Health labs with Fam MD.  Cologuard 2020.  H/O Osteoporosis on Actonel.  BD Osteopenia T-Score -1.8 in 12/2019, will repeat 12/2021.   Past medical history,surgical history, family history and social history were all reviewed and documented in the EPIC chart.  Gynecologic History No LMP recorded. Patient is postmenopausal.  Obstetric History OB History  Gravida Para Term Preterm AB Living  1 1       1   SAB IAB Ectopic Multiple Live Births               # Outcome Date GA Lbr Len/2nd Weight Sex Delivery Anes PTL Lv  1 Para              ROS: A ROS was performed and pertinent positives and negatives are included in the history. GENERAL: No fevers or chills. HEENT: No change in vision, no earache, sore throat or sinus congestion. NECK: No pain or stiffness. CARDIOVASCULAR: No chest pain or pressure. No palpitations. PULMONARY: No shortness of breath, cough or wheeze. GASTROINTESTINAL: No abdominal pain, nausea, vomiting or diarrhea, melena or bright red blood per rectum. GENITOURINARY: No urinary frequency, urgency, hesitancy or dysuria. MUSCULOSKELETAL: No joint or muscle pain, no back pain, no recent trauma. DERMATOLOGIC: No rash, no itching, no lesions. ENDOCRINE: No polyuria, polydipsia, no heat or cold intolerance. No recent change in weight. HEMATOLOGICAL: No anemia or easy bruising or bleeding. NEUROLOGIC: No headache, seizures, numbness, tingling or weakness. PSYCHIATRIC: No depression, no loss of interest in normal activity or  change in sleep pattern.     Exam:   BP 140/80    Pulse 72    Resp 16    Ht 4' 11.75" (1.518 m)    Wt 159 lb (72.1 kg)    BMI 31.31 kg/m   Body mass index is 31.31 kg/m.  General appearance : Well developed well nourished female. No acute distress HEENT: Eyes: no retinal hemorrhage or exudates,  Neck supple, trachea midline, no carotid bruits, no thyroidmegaly Lungs: Clear to auscultation, no rhonchi or wheezes, or rib retractions  Heart: Regular rate and rhythm, no murmurs or gallops Breast:Examined in sitting and supine position were symmetrical in appearance, no palpable masses or tenderness,  no skin retraction, no nipple inversion, no nipple discharge, no skin discoloration, no axillary or supraclavicular lymphadenopathy Abdomen: no palpable masses or tenderness, no rebound or guarding Extremities: no edema or skin discoloration or tenderness  Pelvic: Vulva: Normal             Vagina: No gross lesions or discharge  Cervix: No gross lesions or discharge  Uterus  AV, normal size, shape and consistency, non-tender and mobile  Adnexa  Without masses or tenderness  Anus: Normal   Assessment/Plan:  66 y.o. female for annual exam   1. Well female exam with routine gynecological exam Postmenopause, well on no HRT.  No PMB.  No pelvic pain.  Abstinent.  Pap Neg 05/2020.  Urine/BMs normal.  Breasts  normal.  Mammo Neg 11/2020. BMI 31.31. Walking.  Health labs with Fam MD.  Cologuard 2020.  H/O Osteoporosis on Actonel.  BD Osteopenia T-Score -1.8 in 12/2019, will repeat 12/2021.  2. At risk of fracture due to osteopenia  3. Postmenopause Postmenopause, well on no HRT.  No PMB.  No pelvic pain.  Abstinent. - DG Bone Density; Future  4. Osteopenia of multiple sites  H/O Osteoporosis on Actonel.  BD Osteopenia T-Score -1.8 in 12/2019, will repeat 12/2021. - DG Bone Density; Future  5. Class 1 obesity due to excess calories without serious comorbidity with body mass index (BMI) of 31.0 to  31.9 in adult Lower calorie/carb diet.  Increase fitness activities.  Other orders - nadolol (CORGARD) 80 MG tablet; Take 120 mg by mouth daily. - ACETAMINOPHEN PO; Take by mouth. - Loperamide HCl (IMODIUM PO); Take by mouth. - risedronate (ACTONEL) 150 MG tablet; Take 1 tablet (150 mg total) by mouth every 30 (thirty) days. with water on empty stomach, nothing by mouth or lie down for next 30 minutes.   Genia Del MD, 3:08 PM 06/27/2021

## 2021-06-30 ENCOUNTER — Ambulatory Visit (INDEPENDENT_AMBULATORY_CARE_PROVIDER_SITE_OTHER): Payer: Medicare PPO | Admitting: Psychology

## 2021-06-30 DIAGNOSIS — F4323 Adjustment disorder with mixed anxiety and depressed mood: Secondary | ICD-10-CM | POA: Diagnosis not present

## 2021-06-30 NOTE — Progress Notes (Signed)
[facility] Counselor/Therapist Progress Note ? ?Patient ID: Traci Strong, MRN: [id number]   ? ?Date: 06/30/21 ? ?Time Spent: 3:07  pm - 3:48 am : 41 Minutes ? ?Treatment Type: Individual Therapy. ? ?Reported Symptoms: Anxiety. ? ?Mental Status Exam: ?Appearance:  Neat and Well Groomed     ?Behavior: Appropriate  ?Motor: Normal  ?Speech/Language:  Clear and Coherent and Normal Rate  ?Affect: Congruent  ?Mood: normal  ?Thought process: normal  ?Thought content:   WNL  ?Sensory/Perceptual disturbances:   WNL  ?Orientation: oriented to person, place, time/date, and situation  ?Attention: Good  ?Concentration: Good  ?Memory: WNL  ?Fund of knowledge:  Good  ?Insight:   Good  ?Judgment:  Good  ?Impulse Control: Good  ? ?Risk Assessment: ?Danger to Self:  No ?Self-injurious Behavior: No ?Danger to Others: No ?Duty to Warn:no ?Physical Aggression / Violence:No  ?Access to Firearms a concern: No  ?Gang Involvement:No  ? ?Subjective:  ? ?Traci Strong participated from home, via video, and consented to treatment. Therapist participated from office. We met online due to [place] pandemic. Clarann reviewed the events of the past week. 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 She noted reaching out to a friend who knew of resources for a cancer caregiver support group. She attended her first session on 06/30/21 and noted it being supportive. We engaged in problem-solving to identify additional areas of support and behavior that could positively affect her mood.  Traci Strong was engaged and motivated during the session and expressed commitment towards her goals.  She will work on her goals of socializing, engaging in self-care, and pursuing respite care.  Therapist praised Farah for her effort and energy during the session.  Follow-up was scheduled.Therapist provided supportive therapy.  ? ?Interventions:  Problem-solving ? ?Diagnosis:  ?Adjustment disorder  with mixed anxiety and depressed mood ? ?Treatment Plan: ? ?Client Abilities/Strengths ?Traci Strong is intelligent, forthcoming, and motivated for change.    ? ?Support System: ?Traci Strong has a supportive family.  ? ?Client Treatment Preferences ?Outpatient Therapy ? ?Client Statement of Needs ?Traci Strong discussed her treatment goals which included consistently engaging in enjoyable activities and self-care. Additionally, she noted a need to bolstering her coping skills during stressful times.  ? ?Treatment Level ?Weekly ? ?Symptoms ?Anxiety: Consistent worry (brother and health, COVID), muscle tension, irritability (improved), unrestful sleep (improved), restlessness (improved).   (Status: maintained) ?Depression: Fatigue (improved), & frequent sadness (improved), & grief.   (Status: maintained) ? ?Goals:  ?Traci Strong experiences symptoms of depression and anxiety. ? ? ?Target Date: 12/01/2021 Frequency: Weekly  ?Progress: 0 Modality: individual  ? ? ?Therapist will provide referrals for additional resources as appropriate.  ?Therapist will provide psycho-education regarding Traci Strong's diagnosis and corresponding treatment approaches and interventions. ?Licensed Clinical Social Worker, [name], LCSW will support the patient's ability to achieve the goals identified. will employ CBT, BA, Problem-solving, Solution Focused, Mindfulness,  coping skills, & other evidenced-based practices will be used to promote progress towards healthy functioning to help manage decrease symptoms associated with her diagnosis.  ? Reduce overall level, frequency, and intensity of the feelings of depression, anxiety and panic evidenced by decreased her overall symptoms from 6 to 7 days/week to 0 to 1 days/week per client report for at least 3 consecutive months. ?Verbally express understanding of the relationship between feelings of depression, anxiety and their impact on thinking patterns and behaviors. ?Verbalize an understanding of the role that  distorted thinking plays  in creating fears, excessive worry, and ruminations. ? ?(Tenishia participated in the creation of the treatment plan) ? ?[name], LCSW ?

## 2021-07-03 ENCOUNTER — Other Ambulatory Visit (HOSPITAL_COMMUNITY): Payer: Self-pay | Admitting: Internal Medicine

## 2021-07-03 DIAGNOSIS — M25551 Pain in right hip: Secondary | ICD-10-CM

## 2021-07-04 ENCOUNTER — Other Ambulatory Visit: Payer: Self-pay

## 2021-07-04 ENCOUNTER — Ambulatory Visit (HOSPITAL_COMMUNITY)
Admission: RE | Admit: 2021-07-04 | Discharge: 2021-07-04 | Disposition: A | Discharge status: Home / Self Care | Payer: Medicare PPO | Source: Ambulatory Visit | Attending: Internal Medicine | Admitting: Internal Medicine

## 2021-07-04 ENCOUNTER — Other Ambulatory Visit (HOSPITAL_COMMUNITY): Payer: Self-pay | Admitting: Internal Medicine

## 2021-07-04 DIAGNOSIS — M25551 Pain in right hip: Secondary | ICD-10-CM | POA: Insufficient documentation

## 2021-07-04 DIAGNOSIS — M79651 Pain in right thigh: Secondary | ICD-10-CM | POA: Diagnosis not present

## 2021-07-04 DIAGNOSIS — R102 Pelvic and perineal pain: Secondary | ICD-10-CM | POA: Diagnosis not present

## 2021-07-17 DIAGNOSIS — H524 Presbyopia: Secondary | ICD-10-CM | POA: Diagnosis not present

## 2021-07-17 DIAGNOSIS — H25813 Combined forms of age-related cataract, bilateral: Secondary | ICD-10-CM | POA: Diagnosis not present

## 2021-07-17 DIAGNOSIS — H52203 Unspecified astigmatism, bilateral: Secondary | ICD-10-CM | POA: Diagnosis not present

## 2021-07-28 ENCOUNTER — Ambulatory Visit: Payer: Medicare PPO | Admitting: Psychology

## 2021-08-03 ENCOUNTER — Ambulatory Visit (INDEPENDENT_AMBULATORY_CARE_PROVIDER_SITE_OTHER): Payer: Medicare PPO | Admitting: Psychology

## 2021-08-03 DIAGNOSIS — F4323 Adjustment disorder with mixed anxiety and depressed mood: Secondary | ICD-10-CM | POA: Diagnosis not present

## 2021-08-03 NOTE — Progress Notes (Signed)
[facility] Counselor/Therapist Progress Note ? ?Patient ID: Traci Strong, MRN: [id number]   ? ?Date: 08/03/21 ? ?Time Spent: 2:34 pm - 3:22 pm : 48 Minutes ? ?Treatment Type: Individual Therapy. ? ?Reported Symptoms: Anxiety. ? ?Mental Status Exam: ?Appearance:  Neat and Well Groomed     ?Behavior: Appropriate  ?Motor: Normal  ?Speech/Language:  Clear and Coherent and Normal Rate  ?Affect: Congruent  ?Mood: normal  ?Thought process: normal  ?Thought content:   WNL  ?Sensory/Perceptual disturbances:   WNL  ?Orientation: oriented to person, place, time/date, and situation  ?Attention: Good  ?Concentration: Good  ?Memory: WNL  ?Fund of knowledge:  Good  ?Insight:   Good  ?Judgment:  Good  ?Impulse Control: Good  ? ?Risk Assessment: ?Danger to Self:  No ?Self-injurious Behavior: No ?Danger to Others: No ?Duty to Warn:no ?Physical Aggression / Violence:No  ?Access to Firearms a concern: No  ?Gang Involvement:No  ? ?Subjective:  ? ?Traci Strong participated from home, via video, and consented to treatment. Therapist participated from office. We met online due to [place] pandemic. Traci Strong reviewed the events of the past week. Traci Strong endorsed experiencing tearfulness and difficulty managing mood which later improved. We explored this during the session. Traci Strong noted continued difficulty with identifying respite for her brother after initially finding something promising that later fell through. Additional contributing factors to mood include missing her long-term partner and her parents who would have been 34 years old this year. We worked on processing her grief during the session. Traci Strong has been diligently working on engaging socially on a consistent basis. Therapist praised Traci Strong for her effort and energy during the session.  Follow-up was scheduled.Therapist provided supportive therapy.  ? ?Interventions: Grief Therapy ? ?Diagnosis:  ?Adjustment disorder with mixed anxiety and depressed mood ? ?Treatment  Plan: ? ?Client Abilities/Strengths ?Traci Strong is intelligent, forthcoming, and motivated for change.    ? ?Support System: ?Traci Strong has a supportive family.  ? ?Client Treatment Preferences ?Outpatient Therapy ? ?Client Statement of Needs ?Traci Strong discussed her treatment goals which included consistently engaging in enjoyable activities and self-care. Additionally, she noted a need to bolstering her coping skills during stressful times.  ? ?Treatment Level ?Weekly ? ?Symptoms ?Anxiety: Consistent worry (brother and health, COVID), muscle tension, irritability (improved), unrestful sleep (improved), restlessness (improved).   (Status: maintained) ?Depression: Fatigue (improved), & frequent sadness (improved), & grief.   (Status: maintained) ? ?Goals:  ?Traci Strong experiences symptoms of depression and anxiety. ? ? ?Target Date: 12/01/2021 Frequency: Weekly  ?Progress: 0 Modality: individual  ? ? ?Therapist will provide referrals for additional resources as appropriate.  ?Therapist will provide psycho-education regarding Traci Strong's diagnosis and corresponding treatment approaches and interventions. ?Licensed Clinical Social Worker, [name], LCSW will support the patient's ability to achieve the goals identified. will employ CBT, BA, Problem-solving, Solution Focused, Mindfulness,  coping skills, & other evidenced-based practices will be used to promote progress towards healthy functioning to help manage decrease symptoms associated with her diagnosis.  ? Reduce overall level, frequency, and intensity of the feelings of depression, anxiety and panic evidenced by decreased her overall symptoms from 6 to 7 days/week to 0 to 1 days/week per client report for at least 3 consecutive months. ?Verbally express understanding of the relationship between feelings of depression, anxiety and their impact on thinking patterns and behaviors. ?Verbalize an understanding of the role that distorted thinking plays in creating fears, excessive  worry, and ruminations. ? ?(Traci Strong participated in the creation of the treatment plan) ? ?  [name], LCSW ?

## 2021-08-31 ENCOUNTER — Ambulatory Visit (INDEPENDENT_AMBULATORY_CARE_PROVIDER_SITE_OTHER): Payer: Medicare PPO | Admitting: Psychology

## 2021-08-31 DIAGNOSIS — F4323 Adjustment disorder with mixed anxiety and depressed mood: Secondary | ICD-10-CM | POA: Diagnosis not present

## 2021-08-31 NOTE — Progress Notes (Signed)
[facility] Counselor/Therapist Progress Note ? ?Patient ID: Traci Strong, MRN: [id number]   ? ?Date: 08/31/21 ? ?Time Spent: 9:05 am -9:56 am : 51 Minutes ? ?Treatment Type: Individual Therapy. ? ?Reported Symptoms: Anxiety. ? ?Mental Status Exam: ?Appearance:  Neat and Well Groomed     ?Behavior: Appropriate  ?Motor: Normal  ?Speech/Language:  Clear and Coherent and Normal Rate  ?Affect: Congruent  ?Mood: anxious  ?Thought process: normal  ?Thought content:   WNL  ?Sensory/Perceptual disturbances:   WNL  ?Orientation: oriented to person, place, time/date, and situation  ?Attention: Good  ?Concentration: Good  ?Memory: WNL  ?Fund of knowledge:  Good  ?Insight:   Good  ?Judgment:  Good  ?Impulse Control: Good  ? ?Risk Assessment: ?Danger to Self:  No ?Self-injurious Behavior: No ?Danger to Others: No ?Duty to Warn:no ?Physical Aggression / Violence:No  ?Access to Firearms a concern: No  ?Gang Involvement:No  ? ?Subjective:  ? ?Traci Strong participated from home, via video, and consented to treatment. Therapist participated from office. We met online due to [place] pandemic. Traci Strong reviewed the events of the past week. Traci Strong noted feeling "pretty anxious" due to work being done on her home. She noted things being out of order, which is increasing her overall anxiety. We reviewed her coping skills during the session to manage these stressors. She noted feelings of guilt for wanting to get her brother respite and disconnect for a weekend. She noted feeling hurt by her daughter's recent visit to town and not communicating or spending time with Traci Strong. She noted her reluctance to communicate her feelings to her daughter due to worry regarding her possible reaction.  We processed this and therapist aided processing this during the session and worked on identifying reasonable expectations for daughter. We reviewed positive and assertive communication & conflict resolution. Therapist praised Traci Strong for her effort  and energy during the session.  Follow-up was scheduled.Therapist provided supportive therapy.  ? ?Interventions: Interpersonal ? ?Diagnosis:  ?Adjustment disorder with mixed anxiety and depressed mood ? ?Treatment Plan: ? ?Client Abilities/Strengths ?Traci Strong is intelligent, forthcoming, and motivated for change.    ? ?Support System: ?Traci Strong has a supportive family.  ? ?Client Treatment Preferences ?Outpatient Therapy ? ?Client Statement of Needs ?Traci Strong discussed her treatment goals which included consistently engaging in enjoyable activities and self-care. Additionally, she noted a need to bolstering her coping skills during stressful times.  ? ?Treatment Level ?Weekly ? ?Symptoms ? ?Anxiety: Consistent worry (brother and health, COVID), muscle tension, irritability (improved), unrestful sleep (improved), restlessness (improved).   (Status: declined) ? ?Depression: Fatigue (improved), & frequent sadness (improved), & grief.   (Status: maintained) ? ?Goals:  ?Traci Strong experiences symptoms of depression and anxiety. ? ? ?Target Date: 12/01/2021 Frequency: Weekly  ?Progress: 0 Modality: individual  ? ? ?Therapist will provide referrals for additional resources as appropriate.  ?Therapist will provide psycho-education regarding Traci Strong's diagnosis and corresponding treatment approaches and interventions. ?Licensed Clinical Social Worker, [name], LCSW will support the patient's ability to achieve the goals identified. will employ CBT, BA, Problem-solving, Solution Focused, Mindfulness,  coping skills, & other evidenced-based practices will be used to promote progress towards healthy functioning to help manage decrease symptoms associated with her diagnosis.  ? Reduce overall level, frequency, and intensity of the feelings of depression, anxiety and panic evidenced by decreased her overall symptoms from 6 to 7 days/week to 0 to 1 days/week per client report for at least 3 consecutive months. ?Verbally express  understanding of the  relationship between feelings of depression, anxiety and their impact on thinking patterns and behaviors. ?Verbalize an understanding of the role that distorted thinking plays in creating fears, excessive worry, and ruminations. ? ?(Derrisha participated in the creation of the treatment plan) ? ?[name], LCSW ?

## 2021-09-22 DIAGNOSIS — M546 Pain in thoracic spine: Secondary | ICD-10-CM | POA: Diagnosis not present

## 2021-09-22 DIAGNOSIS — M62830 Muscle spasm of back: Secondary | ICD-10-CM | POA: Diagnosis not present

## 2021-09-22 DIAGNOSIS — M542 Cervicalgia: Secondary | ICD-10-CM | POA: Diagnosis not present

## 2021-09-22 DIAGNOSIS — M9901 Segmental and somatic dysfunction of cervical region: Secondary | ICD-10-CM | POA: Diagnosis not present

## 2021-09-22 DIAGNOSIS — M9902 Segmental and somatic dysfunction of thoracic region: Secondary | ICD-10-CM | POA: Diagnosis not present

## 2021-09-22 DIAGNOSIS — M9903 Segmental and somatic dysfunction of lumbar region: Secondary | ICD-10-CM | POA: Diagnosis not present

## 2021-09-28 ENCOUNTER — Ambulatory Visit (INDEPENDENT_AMBULATORY_CARE_PROVIDER_SITE_OTHER): Payer: Medicare PPO | Admitting: Psychology

## 2021-09-28 DIAGNOSIS — F4323 Adjustment disorder with mixed anxiety and depressed mood: Secondary | ICD-10-CM

## 2021-09-28 NOTE — Progress Notes (Signed)
[facility] Counselor/Therapist Progress Note  Patient ID: Traci Strong, MRN: [id number]    Date: 09/28/21  Time Spent: 9:05 am -9:54 am : 26 Minutes  Treatment Type: Individual Therapy.  Reported Symptoms: Anxiety.  Mental Status Exam: Appearance:  Neat and Well Groomed     Behavior: Appropriate  Motor: Normal  Speech/Language:  Clear and Coherent and Normal Rate  Affect: Congruent  Mood: anxious  Thought process: normal  Thought content:   WNL  Sensory/Perceptual disturbances:   WNL  Orientation: oriented to person, place, time/date, and situation  Attention: Good  Concentration: Good  Memory: WNL  Fund of knowledge:  Good  Insight:   Good  Judgment:  Good  Impulse Control: Good   Risk Assessment: Danger to Self:  No Self-injurious Behavior: No Danger to Others: No Duty to Warn:no Physical Aggression / Violence:No  Access to Firearms a concern: No  Gang Involvement:No   Subjective:   Inara Dike Boonstra participated from home, via video, and consented to treatment. Therapist participated from home office. We met online due to [place] pandemic. Sherese reviewed the events of the past week. She noted being more social and noted her attempts to schedule social activities together. She continued to attend the caregiver support group, which therapist praised. She endorsed a higher level of anxiety due to financial stressors. She discussed the stressors of being her brother's caretaker and noted her attempts to communicate this to her brother and balance her health. She noted her blood pressure being up as a result of the stress. We discussed ways to engage in consistent relaxation. Therapist encouraged follow-up with her PCP, procure her own bp cuff, and encouraged exercise on a consistent basis. [place] was engaged and receptive during the session. Therapist praised Timarie for her effort and energy during the session.  Follow-up was scheduled.Therapist provided supportive  therapy.   Interventions:  problem solving and self-care interventions  Diagnosis:  Adjustment disorder with mixed anxiety and depressed mood  Treatment Plan:  Client Abilities/Strengths Primrose is intelligent, forthcoming, and motivated for change.     Support System: Tea has a supportive family.   Client Treatment Preferences Outpatient Therapy  Client Statement of Needs Malayasia discussed her treatment goals which included consistently engaging in enjoyable activities and self-care. Additionally, she noted a need to bolstering her coping skills during stressful times.   Treatment Level Weekly  Symptoms  Anxiety: Consistent worry (brother and health, COVID), muscle tension, irritability (improved), unrestful sleep (improved), restlessness (improved).   (Status: declined)  Depression: Fatigue (improved), & frequent sadness (improved), & grief.   (Status: maintained)  Goals:  Emmelyn experiences symptoms of depression and anxiety.   Target Date: 12/01/2021 Frequency: Weekly  Progress: 0 Modality: individual    Therapist will provide referrals for additional resources as appropriate.  Therapist will provide psycho-education regarding Zonia's diagnosis and corresponding treatment approaches and interventions. Licensed Clinical Social Worker, [place], LCSW will support the patient's ability to achieve the goals identified. will employ CBT, BA, Problem-solving, Solution Focused, Mindfulness,  coping skills, & other evidenced-based practices will be used to promote progress towards healthy functioning to help manage decrease symptoms associated with her diagnosis.   Reduce overall level, frequency, and intensity of the feelings of depression, anxiety and panic evidenced by decreased her overall symptoms from 6 to 7 days/week to 0 to 1 days/week per client report for at least 3 consecutive months. Verbally express understanding of the relationship between feelings of depression,  anxiety and their impact  on thinking patterns and behaviors. Verbalize an understanding of the role that distorted thinking plays in creating fears, excessive worry, and ruminations.  [name] participated in the creation of the treatment plan)  [name], LCSW

## 2021-10-02 DIAGNOSIS — I10 Essential (primary) hypertension: Secondary | ICD-10-CM | POA: Diagnosis not present

## 2021-10-02 DIAGNOSIS — N1831 Chronic kidney disease, stage 3a: Secondary | ICD-10-CM | POA: Diagnosis not present

## 2021-10-02 DIAGNOSIS — G43909 Migraine, unspecified, not intractable, without status migrainosus: Secondary | ICD-10-CM | POA: Diagnosis not present

## 2021-10-02 DIAGNOSIS — Z79899 Other long term (current) drug therapy: Secondary | ICD-10-CM | POA: Diagnosis not present

## 2021-10-09 DIAGNOSIS — N1831 Chronic kidney disease, stage 3a: Secondary | ICD-10-CM | POA: Diagnosis not present

## 2021-10-09 DIAGNOSIS — I10 Essential (primary) hypertension: Secondary | ICD-10-CM | POA: Diagnosis not present

## 2021-10-09 DIAGNOSIS — D519 Vitamin B12 deficiency anemia, unspecified: Secondary | ICD-10-CM | POA: Diagnosis not present

## 2021-10-16 DIAGNOSIS — M9902 Segmental and somatic dysfunction of thoracic region: Secondary | ICD-10-CM | POA: Diagnosis not present

## 2021-10-16 DIAGNOSIS — M9901 Segmental and somatic dysfunction of cervical region: Secondary | ICD-10-CM | POA: Diagnosis not present

## 2021-10-16 DIAGNOSIS — M62830 Muscle spasm of back: Secondary | ICD-10-CM | POA: Diagnosis not present

## 2021-10-16 DIAGNOSIS — M9903 Segmental and somatic dysfunction of lumbar region: Secondary | ICD-10-CM | POA: Diagnosis not present

## 2021-10-16 DIAGNOSIS — M542 Cervicalgia: Secondary | ICD-10-CM | POA: Diagnosis not present

## 2021-10-16 DIAGNOSIS — M546 Pain in thoracic spine: Secondary | ICD-10-CM | POA: Diagnosis not present

## 2021-11-06 ENCOUNTER — Ambulatory Visit (INDEPENDENT_AMBULATORY_CARE_PROVIDER_SITE_OTHER): Payer: Medicare PPO | Admitting: Psychology

## 2021-11-06 DIAGNOSIS — F4323 Adjustment disorder with mixed anxiety and depressed mood: Secondary | ICD-10-CM | POA: Diagnosis not present

## 2021-11-06 NOTE — Progress Notes (Signed)
[facility] Counselor/Therapist Progress Note  Patient ID: Traci Strong, MRN: [id number]    Date: 11/06/21  Time Spent: 1:31 pm - 2:18 pm :  10 Minutes  Treatment Type: Individual Therapy.  Reported Symptoms: Anxiety.  Mental Status Exam: Appearance:  Neat and Well Groomed     Behavior: Appropriate  Motor: Normal  Speech/Language:  Clear and Coherent and Normal Rate  Affect: Congruent  Mood: anxious  Thought process: normal  Thought content:   WNL  Sensory/Perceptual disturbances:   WNL  Orientation: oriented to person, place, time/date, and situation  Attention: Good  Concentration: Good  Memory: WNL  Fund of knowledge:  Good  Insight:   Good  Judgment:  Good  Impulse Control: Good   Risk Assessment: Danger to Self:  No Self-injurious Behavior: No Danger to Others: No Duty to Warn:no Physical Aggression / Violence:No  Access to Firearms a concern: No  Gang Involvement:No   Subjective:   Traci Strong participated from home, via video, and consented to treatment. Therapist participated from home office. We met online due to [place] pandemic. Traci Strong reviewed the events of the past week. Traci Strong noted her attempts to engage socially on a more consistent basis, which was a past therapeutic goal. She noted her attempts to volunteer, which therapist praised. She noted having anxiety related to her brother's finances. We worked on identifying ways to manage this. She noted he continued attempts to identify respite resources to get supervision for her brother as she receives a care-taking break. She noted increased grief, during this 4th of July, regarding her partner [name] passing. We continued to process this during the session. Therapist praised Traci Strong for her effort to problem-solve and actively manage her symptoms. Therapist validated Traci Strong's feelings and provided supportive therapy. Therapist encouraged use of relaxation techniques, particularly breathing exercises,  when Traci Strong's BP is reading high.   Interventions:  problem solving and self-care interventions  Diagnosis:  Adjustment disorder with mixed anxiety and depressed mood  Treatment Plan:  Client Abilities/Strengths Traci Strong is intelligent, forthcoming, and motivated for change.     Support System: Traci Strong has a supportive family.   Client Treatment Preferences Outpatient Therapy  Client Statement of Needs Traci Strong discussed her treatment goals which included consistently engaging in enjoyable activities and self-care. Additionally, she noted a need to bolstering her coping skills during stressful times.   Treatment Level Weekly  Symptoms  Anxiety: Consistent worry (brother and health, COVID), muscle tension, irritability (improved), unrestful sleep (improved), restlessness (improved).   (Status: declined)  Depression: Fatigue (improved), & frequent sadness (improved), & grief.   (Status: maintained)  Goals:  Traci Strong experiences symptoms of depression and anxiety.   Target Date: 12/01/2021 Frequency: Weekly  Progress: 0 Modality: individual    Therapist will provide referrals for additional resources as appropriate.  Therapist will provide psycho-education regarding Traci Strong's diagnosis and corresponding treatment approaches and interventions. Licensed Clinical Social Worker, [name], LCSW will support the patient's ability to achieve the goals identified. will employ CBT, BA, Problem-solving, Solution Focused, Mindfulness,  coping skills, & other evidenced-based practices will be used to promote progress towards healthy functioning to help manage decrease symptoms associated with her diagnosis.   Reduce overall level, frequency, and intensity of the feelings of depression, anxiety and panic evidenced by decreased her overall symptoms from 6 to 7 days/week to 0 to 1 days/week per client report for at least 3 consecutive months. Verbally express understanding of the relationship between  feelings of depression, anxiety and their  impact on thinking patterns and behaviors. Verbalize an understanding of the role that distorted thinking plays in creating fears, excessive worry, and ruminations.  Traci Strong participated in the creation of the treatment plan)  Traci Irish, LCSW

## 2021-12-07 ENCOUNTER — Ambulatory Visit (INDEPENDENT_AMBULATORY_CARE_PROVIDER_SITE_OTHER): Payer: Medicare PPO | Admitting: Psychology

## 2021-12-07 DIAGNOSIS — F4323 Adjustment disorder with mixed anxiety and depressed mood: Secondary | ICD-10-CM | POA: Diagnosis not present

## 2021-12-07 NOTE — Progress Notes (Signed)
Comprehensive Clinical Assessment (CCA) Note  12/07/2021 Traci Strong [id number]  Time Spent: 2:03  pm - 3:00 pm: 67 Minutes  Chief Complaint: No chief complaint on file.  Visit Diagnosis: F43.23   Guardian/Payee:  Self    Paperwork requested: No   Reason for Visit /Presenting Problem: depression and anxiety.   Mental Status Exam: Appearance:   Well Groomed     Behavior:  Appropriate  Motor:  Normal  Speech/Language:   Clear and Coherent  Affect:  Congruent  Mood:  normal  Thought process:  normal  Thought content:    WNL  Sensory/Perceptual disturbances:    WNL  Orientation:  oriented to person, place, time/date, and situation  Attention:  Good  Concentration:  Good  Memory:  WNL  Fund of knowledge:   Good  Insight:    Good  Judgment:   Good  Impulse Control:  Good   Reported Symptoms:  Depression and anxiety.   Risk Assessment: Danger to Self:  No Self-injurious Behavior: No Danger to Others: No Duty to Warn:no Physical Aggression / Violence:No  Access to Firearms a concern: No  Gang Involvement:No  Patient / guardian was educated about steps to take if suicide or homicide risk level increases between visits: no While future psychiatric events cannot be accurately predicted, the patient does not currently require acute inpatient psychiatric care and does not currently meet [facility] involuntary commitment criteria.  Substance Abuse History: Current substance abuse: No     Caffeine: ~ 6oz -~16oz of mt dew daily (72 mg caff).  Tobacco: none Alcohol: none  Past Psychiatric History:   Previous psychological history is significant for anxiety and depression Outpatient Providers:Traci Fieldhouse, LCSW History of Psych Hospitalization: No  Psychological Testing:  n/a    Abuse History:  Victim of: No.,  n/a    Report needed: No. Victim of Neglect:No. Perpetrator of  na   Witness / Exposure to Domestic Violence: No   Protective Services Involvement: No   Witness to [employer] Violence:  No   Family History:  Family History  Problem Relation Age of Onset   Hypertension Mother    Heart attack Mother    Parkinson's disease Mother    Cancer Father        prostate   Hypertension Father    Aneurysm Father    Cancer Brother        thyroid   Hypertension Brother    Diabetes Maternal Grandmother    Heart attack Maternal Grandmother    Breast cancer Paternal Grandmother    Heart attack Paternal Grandmother    Hypertension Daughter     Living situation: the patient lives alone  Sexual Orientation: Straight  Relationship Status: widowed  Name of spouse / other: Traci Strong (deceased) If a parent, number of children / ages: Traci Strong - 38.   Support Systems: friends  Financial Stress:  Yes   Income/Employment/Disability: Retired  Armed forces logistics/support/administrative officer: No   Educational History: Education: Scientist, product/process development: christian  Any cultural differences that may affect / interfere with treatment:  not applicable   Recreation/Hobbies: Reading, working on house.   Stressors: Health problems   Other: care taking stressors    Strengths: Supportive Relationships, Church, [employer], Spirituality, Hopefulness, and Conservator, museum/gallery  Barriers:  brother's health, strain with daughter.    Legal History: Pending legal issue / charges: The patient has no significant history of legal issues. History of legal issue / charges:  na  Medical History/Surgical History: reviewed  Past Medical History:  Diagnosis Date   Hypertension    Migraines    Numbness    Osteoporosis 10/2017   T score -2.7    Past Surgical History:  Procedure Laterality Date   TOOTH EXTRACTION     WISDOM TOOTH EXTRACTION      Medications: Current Outpatient Medications  Medication Sig Dispense Refill   ACETAMINOPHEN PO Take by mouth.     Loperamide HCl (IMODIUM PO) Take by mouth.     losartan (COZAAR) 100 MG tablet Take 100 mg by  mouth daily.     nadolol (CORGARD) 80 MG tablet Take 120 mg by mouth daily.     risedronate (ACTONEL) 150 MG tablet Take 1 tablet (150 mg total) by mouth every 30 (thirty) days. with water on empty stomach, nothing by mouth or lie down for next 30 minutes. 3 tablet 4   SUMAtriptan (IMITREX) 50 MG tablet Take 50 mg by mouth every 2 (two) hours as needed for migraine. May repeat in 2 hours if headache persists or recurs.     No current facility-administered medications for this visit.    Allergies  Allergen Reactions   Amoxicillin-Pot Clavulanate Rash    Dry heaving   Codeine Other (See Comments)    Increase heart rate   Prednisone Rash    Dry heaving    Diagnoses:  Adjustment disorder with mixed anxiety and depressed mood  Plan of Care: Continued outpatient therapy.   Narrative:   Traci Strong participated from home, via video, and consented to treatment. Therapist participated from  home office. We met online due to [place] pandemic. We completed the annual assessment during this session. Traci Strong noted her current stressors which include her brother's ailing health, her daughter's general attitude and lack of empathetic approach, the weight of care-taking full-time, and grieving for her partner. She noted a recent increase in her blood pressure and a need to adjust to medication with her PCP's guidance. She noted a recent fluctuation in her BP (~130/79). She noted the highest BP being 170/105. She noted this being primarily in relation to stress. She noted her daughter being in [place] and recently having her own daughter. She noted strain in her relationship and Traci Strong noted, at times, avoiding her due to these dynamics. Traci Strong noted he brother's health often fluctuating due to numerous significant health issues most recently requiring a partial toe amputation due to infection. Traci Strong noted currently journaling to manage stress in redlation to her daughter. She continued to struggle to cope with  day-to-day stressors and the stress of care-taking. Traci Strong would benefit from consistent counseling to manage mood, processing past events, bolstering coping skills, and setting boundaries with others. Additionally, she would benefit from consistent relaxation and stress management. She presented as intelligent, self-aware, and motivated for change.    [name], LCSW

## 2022-01-04 ENCOUNTER — Ambulatory Visit (INDEPENDENT_AMBULATORY_CARE_PROVIDER_SITE_OTHER): Payer: Medicare PPO | Admitting: Psychology

## 2022-01-04 DIAGNOSIS — F4323 Adjustment disorder with mixed anxiety and depressed mood: Secondary | ICD-10-CM

## 2022-01-04 NOTE — Progress Notes (Signed)
[facility] Counselor/Therapist Progress Note  Patient ID: Traci Strong, MRN: [id number]   Date: 01/04/22  Time Spent: 9:00  am - 9:57 am : 72 Minutes  Treatment Type: Individual Therapy.  Reported Symptoms: anxiety and depression.   Mental Status Exam: Appearance:  Well Groomed     Behavior: Appropriate  Motor: Normal  Speech/Language:  Clear and Coherent  Affect: Appropriate  Mood: dysthymic  Thought process: normal  Thought content:   WNL  Sensory/Perceptual disturbances:   WNL  Orientation: oriented to person, place, time/date, and situation  Attention: Good  Concentration: Good  Memory: WNL  Fund of knowledge:  Good  Insight:   Good  Judgment:  Good  Impulse Control: Good   Risk Assessment: Danger to Self:  No Self-injurious Behavior: No Danger to Others: No Duty to Warn:no Physical Aggression / Violence:No  Access to Firearms a concern: No  Gang Involvement:No   Subjective:   Traci Strong participated from home, via video and consented to treatment. Therapist participated from home office. We met online due to [place] pandemic. Traci Strong reviewed the events of the past week. We reviewed numerous treatment approaches including CBT, BA, Problem Solving, and Solution focused therapy. Psych-education regarding the Kaidynce's diagnosis of Adjustment disorder with mixed anxiety and depressed mood was provided during the session. We discussed Traci Strong's goals treatment goals which include frequent exercise, verbalizing feeling towards others, communicate more consistently, reduce avoidance, reduce negative self-talk, think more more positively, highlight strengths and positives for self and others, creating for boundaries for self and others. Traci Strong provided verbal approval of the treatment plan. Barriers include self and schedule.   Interventions: Psycho-education & Goal Setting.   Diagnosis:  Adjustment disorder with mixed anxiety and depressed  mood   Treatment Plan:  Client Abilities/Strengths Traci Strong is intelligent, forthcoming, and motivated for change.    Support System: Family and friends.    Client Treatment Preferences Outpatient therapy.   Client Statement of Needs Traci Strong would like to verbalizing feeling towards others, communicate more consistently, reduce avoidance, reduce negative self-talk, think more more positively, highlight strengths and positives for self and others, creating for boundaries for self and others.   Treatment Level Weekly  Symptoms  Anxiety: Worry, rumination,  worry about varying things (finances, getting things done, brother's health), difficulty managing worry, irritability, increased bp & hrt rate, claustrophobia, and history of panic (Status: maintained)  Depression: fluctuating sleep (middle insomnia), lethargy, low motivation, infrequent sadness, infrequent difficulty concentrating, irritability, weight-gain, and guilt  (Status: maintained)  Goals:   Traci Strong experiences symptoms of anxiety and depression.    Target Date: 01/05/23 Frequency: Weekly  Progress: 0 Modality: individual    Therapist will provide referrals for additional resources as appropriate.  Therapist will provide psycho-education regarding Traci Strong's diagnosis and corresponding treatment approaches and interventions. Licensed Clinical Social Worker, [place], LCSW will support the patient's ability to achieve the goals identified. will employ CBT, BA, Problem-solving, Solution Focused, Mindfulness,  coping skills, & other evidenced-based practices will be used to promote progress towards healthy functioning to help manage decrease symptoms associated with her diagnosis.   Reduce overall level, frequency, and intensity of the feelings of depression, anxiety and interpersonal stress evidenced by decreased overall symptoms from 6 to 7 days/week to 0 to 1 days/week per client report for at least 3 consecutive  months. Verbally express understanding of the relationship between feelings of depression, anxiety and their impact on thinking patterns and behaviors. Verbalize an understanding  of the role that distorted thinking plays in creating fears, excessive worry, and ruminations.    Traci Strong participated in the creation of the treatment plan)  Traci Irish, LCSW

## 2022-01-10 ENCOUNTER — Other Ambulatory Visit: Payer: Self-pay | Admitting: Obstetrics & Gynecology

## 2022-01-10 DIAGNOSIS — Z1231 Encounter for screening mammogram for malignant neoplasm of breast: Secondary | ICD-10-CM

## 2022-01-11 DIAGNOSIS — N1831 Chronic kidney disease, stage 3a: Secondary | ICD-10-CM | POA: Diagnosis not present

## 2022-01-11 DIAGNOSIS — I10 Essential (primary) hypertension: Secondary | ICD-10-CM | POA: Diagnosis not present

## 2022-01-19 DIAGNOSIS — Z23 Encounter for immunization: Secondary | ICD-10-CM | POA: Diagnosis not present

## 2022-01-30 ENCOUNTER — Ambulatory Visit (INDEPENDENT_AMBULATORY_CARE_PROVIDER_SITE_OTHER): Payer: Medicare PPO | Admitting: Psychology

## 2022-01-30 DIAGNOSIS — F4323 Adjustment disorder with mixed anxiety and depressed mood: Secondary | ICD-10-CM

## 2022-01-30 NOTE — Progress Notes (Signed)
[facility] Counselor/Therapist Progress Note  Patient ID: MALONE ADMIRE, MRN: [id number]   Date: 01/30/22  Time Spent: 9:04 am - 10:00 am : 6 Minutes  Treatment Type: Individual Therapy.  Reported Symptoms: anxiety and depression.   Mental Status Exam: Appearance:  Well Groomed     Behavior: Appropriate  Motor: Normal  Speech/Language:  Clear and Coherent  Affect: Appropriate  Mood: dysthymic  Thought process: normal  Thought content:   WNL  Sensory/Perceptual disturbances:   WNL  Orientation: oriented to person, place, time/date, and situation  Attention: Good  Concentration: Good  Memory: WNL  Fund of knowledge:  Good  Insight:   Good  Judgment:  Good  Impulse Control: Good   Risk Assessment: Danger to Self:  No Self-injurious Behavior: No Danger to Others: No Duty to Warn:no Physical Aggression / Violence:No  Access to Firearms a concern: No  Gang Involvement:No   Subjective:   Seema Blum Beauchaine participated from home, via video and consented to treatment. Therapist participated from office. We met online due to [place] pandemic. Doyle reviewed the events of the past week. Theola noted her brother's rehospitalization after recently returning home from rehab facility. She noted her worry and anxiety about her brother's continuing ailing health. She noted this being a reminder of her partner, Clair Gulling, who passed some years back. She noted the possibility of palliative care and noted this could be the end being near". She noted heightened anxiety at night, and noted keeping the phone in bed with her to ensure she can respond quickly to her brother, should the need arise. She noted focusing on eating better, managing and monitoring her BP,  and completing tasks. She noted her hopes to get organized for end of life planning for her brother and for herself. Therapist praised Leshonda for thinking and acting proactively. Whitlee was engaged and motivated during the session.    Interventions: CBT and problem solving  Diagnosis:  Adjustment disorder with mixed anxiety and depressed mood   Treatment Plan:  Client Abilities/Strengths Yolandra is intelligent, forthcoming, and motivated for change.    Support System: Family and friends.    Client Treatment Preferences Outpatient therapy.   Client Statement of Needs Gillian would like to verbalizing feeling towards others, communicate more consistently, reduce avoidance, reduce negative self-talk, think more more positively, highlight strengths and positives for self and others, creating for boundaries for self and others.   Treatment Level Weekly  Symptoms  Anxiety: Worry, rumination,  worry about varying things (finances, getting things done, brother's health), difficulty managing worry, irritability, increased bp & hrt rate, claustrophobia, and history of panic (Status: maintained)  Depression: fluctuating sleep (middle insomnia), lethargy, low motivation, infrequent sadness, infrequent difficulty concentrating, irritability, weight-gain, and guilt  (Status: maintained)  Goals:   Rosanne experiences symptoms of anxiety and depression.    Target Date: 01/05/23 Frequency: Weekly  Progress: 0 Modality: individual    Therapist will provide referrals for additional resources as appropriate.  Therapist will provide psycho-education regarding Melinna's diagnosis and corresponding treatment approaches and interventions. Licensed Clinical Social Worker, [place], LCSW will support the patient's ability to achieve the goals identified. will employ CBT, BA, Problem-solving, Solution Focused, Mindfulness,  coping skills, & other evidenced-based practices will be used to promote progress towards healthy functioning to help manage decrease symptoms associated with her diagnosis.   Reduce overall level, frequency, and intensity of the feelings of depression, anxiety and interpersonal stress evidenced by decreased  overall symptoms from 6 to  7 days/week to 0 to 1 days/week per client report for at least 3 consecutive months. Verbally express understanding of the relationship between feelings of depression, anxiety and their impact on thinking patterns and behaviors. Verbalize an understanding of the role that distorted thinking plays in creating fears, excessive worry, and ruminations.    [name] participated in the creation of the treatment plan)  [name], LCSW

## 2022-02-06 ENCOUNTER — Ambulatory Visit (INDEPENDENT_AMBULATORY_CARE_PROVIDER_SITE_OTHER): Payer: Medicare PPO

## 2022-02-06 ENCOUNTER — Other Ambulatory Visit: Payer: Self-pay | Admitting: Obstetrics & Gynecology

## 2022-02-06 ENCOUNTER — Ambulatory Visit
Admission: RE | Admit: 2022-02-06 | Discharge: 2022-02-06 | Disposition: A | Discharge status: Home / Self Care | Payer: Medicare PPO | Source: Ambulatory Visit | Attending: Obstetrics & Gynecology | Admitting: Obstetrics & Gynecology

## 2022-02-06 DIAGNOSIS — M8589 Other specified disorders of bone density and structure, multiple sites: Secondary | ICD-10-CM

## 2022-02-06 DIAGNOSIS — Z780 Asymptomatic menopausal state: Secondary | ICD-10-CM

## 2022-02-06 DIAGNOSIS — Z1231 Encounter for screening mammogram for malignant neoplasm of breast: Secondary | ICD-10-CM | POA: Diagnosis not present

## 2022-02-06 DIAGNOSIS — Z13820 Encounter for screening for osteoporosis: Secondary | ICD-10-CM

## 2022-02-27 ENCOUNTER — Ambulatory Visit (INDEPENDENT_AMBULATORY_CARE_PROVIDER_SITE_OTHER): Payer: Medicare PPO | Admitting: Psychology

## 2022-02-27 DIAGNOSIS — F4323 Adjustment disorder with mixed anxiety and depressed mood: Secondary | ICD-10-CM

## 2022-02-27 NOTE — Progress Notes (Signed)
[facility] Counselor/Therapist Progress Note  Patient ID: Traci Strong, MRN: [id number]   Date: 02/27/22  Time Spent: 9:05 am - 10:04 am : 59  Minutes  Treatment Type: Individual Therapy.  Reported Symptoms: anxiety and depression.   Mental Status Exam: Appearance:  Well Groomed     Behavior: Appropriate  Motor: Normal  Speech/Language:  Clear and Coherent  Affect: Appropriate  Mood: dysthymic  Thought process: normal  Thought content:   WNL  Sensory/Perceptual disturbances:   WNL  Orientation: oriented to person, place, time/date, and situation  Attention: Good  Concentration: Good  Memory: WNL  Fund of knowledge:  Good  Insight:   Good  Judgment:  Good  Impulse Control: Good   Risk Assessment: Danger to Self:  No Self-injurious Behavior: No Danger to Others: No Duty to Warn:no Physical Aggression / Violence:No  Access to Firearms a concern: No  Gang Involvement:No   Subjective:   Ilsa Bonello Locken participated from home, via video and consented to treatment. Therapist participated from office. We met online due to [place] pandemic. Leenah reviewed the events of the past week.Brandon noted her brother's declining health and noted her anxiety regarding the possibility of his passing. She noted "I'd like to be his sister and not his caregiver". She noted being reminded of care-taking for her partner Clair Gulling who passed. She noted her efforts to spend quality time with her brother and noted writing him a letter to express her feelings. She noted worry about past decisions and if they were the "right" decisions. We explored this during the session and discussed the importance to setting boundaries regarding this, managing the negative self-talk, and managing this during the session. Therapist validated and normalized Ambera's feelings and provided supportive empathy. Donnesha will call prior to her appointment, as needed.   Interventions: CBT and interpersonal  Diagnosis:   Adjustment disorder with mixed anxiety and depressed mood  Treatment Plan:  Client Abilities/Strengths Galadriel is intelligent, forthcoming, and motivated for change.    Support System: Family and friends.    Client Treatment Preferences Outpatient therapy.   Client Statement of Needs Atonya would like to verbalizing feeling towards others, communicate more consistently, reduce avoidance, reduce negative self-talk, think more more positively, highlight strengths and positives for self and others, creating for boundaries for self and others.   Treatment Level Weekly  Symptoms  Anxiety: Worry, rumination,  worry about varying things (finances, getting things done, brother's health), difficulty managing worry, irritability, increased bp & hrt rate, claustrophobia, and history of panic (Status: maintained)  Depression: fluctuating sleep (middle insomnia), lethargy, low motivation, infrequent sadness, infrequent difficulty concentrating, irritability, weight-gain, and guilt  (Status: maintained)  Goals:   Astraea experiences symptoms of anxiety and depression.    Target Date: 01/05/23 Frequency: Weekly  Progress: 0 Modality: individual    Therapist will provide referrals for additional resources as appropriate.  Therapist will provide psycho-education regarding Azlin's diagnosis and corresponding treatment approaches and interventions. Licensed Clinical Social Worker, [place], LCSW will support the patient's ability to achieve the goals identified. will employ CBT, BA, Problem-solving, Solution Focused, Mindfulness,  coping skills, & other evidenced-based practices will be used to promote progress towards healthy functioning to help manage decrease symptoms associated with her diagnosis.   Reduce overall level, frequency, and intensity of the feelings of depression, anxiety and interpersonal stress evidenced by decreased overall symptoms from 6 to 7 days/week to 0 to 1 days/week per  client report for at least 3 consecutive months.  Verbally express understanding of the relationship between feelings of depression, anxiety and their impact on thinking patterns and behaviors. Verbalize an understanding of the role that distorted thinking plays in creating fears, excessive worry, and ruminations.    [name] participated in the creation of the treatment plan)  [name], LCSW

## 2022-03-09 DIAGNOSIS — M542 Cervicalgia: Secondary | ICD-10-CM | POA: Diagnosis not present

## 2022-03-09 DIAGNOSIS — M546 Pain in thoracic spine: Secondary | ICD-10-CM | POA: Diagnosis not present

## 2022-03-09 DIAGNOSIS — M62830 Muscle spasm of back: Secondary | ICD-10-CM | POA: Diagnosis not present

## 2022-03-09 DIAGNOSIS — M9901 Segmental and somatic dysfunction of cervical region: Secondary | ICD-10-CM | POA: Diagnosis not present

## 2022-03-09 DIAGNOSIS — M9903 Segmental and somatic dysfunction of lumbar region: Secondary | ICD-10-CM | POA: Diagnosis not present

## 2022-03-09 DIAGNOSIS — M9902 Segmental and somatic dysfunction of thoracic region: Secondary | ICD-10-CM | POA: Diagnosis not present

## 2022-03-26 ENCOUNTER — Ambulatory Visit (INDEPENDENT_AMBULATORY_CARE_PROVIDER_SITE_OTHER): Payer: Medicare PPO | Admitting: Psychology

## 2022-03-26 DIAGNOSIS — F4323 Adjustment disorder with mixed anxiety and depressed mood: Secondary | ICD-10-CM | POA: Diagnosis not present

## 2022-03-26 NOTE — Progress Notes (Signed)
[facility] Counselor/Therapist Progress Note  Patient ID: Traci Strong, MRN: [id number]   Date: 03/26/22  Time Spent: 9:04 am - 9:58 am : 28  Minutes  Treatment Type: Individual Therapy.  Reported Symptoms: anxiety and depression.   Mental Status Exam: Appearance:  Well Groomed     Behavior: Appropriate  Motor: Normal  Speech/Language:  Clear and Coherent  Affect: Congruent  Mood: anxious and dysthymic  Thought process: normal  Thought content:   WNL  Sensory/Perceptual disturbances:   WNL  Orientation: oriented to person, place, time/date, and situation  Attention: Good  Concentration: Good  Memory: WNL  Fund of knowledge:  Good  Insight:   Good  Judgment:  Good  Impulse Control: Good   Risk Assessment: Danger to Self:  No Self-injurious Behavior: No Danger to Others: No Duty to Warn:no Physical Aggression / Violence:No  Access to Firearms a concern: No  Gang Involvement:No   Subjective:   Traci Strong participated from home, via video and consented to treatment. Therapist participated from office. We met online due to [place] pandemic. Amandine reviewed the events of the past week.Traci Strong noted her brother's continued declining health and overall functioning. She noted her worry that his health will continue to decline and the need for the possible assistance in the home. She noted her daughter being supportive. Traci Strong noted experiencing anticipatory grief as a result. She noted fluctuation in mood including feelings of sadness and anger. She noted frustration regarding work on her home and the workmanship being poor, at best. She noted being mad, upset, and stressed. We worked on identifying areas of control, ways to relax, and engagement in self-care. She noted her efforts to get support from family and friends and to take breaks from the stressors. She noted her frustration regarding the lack of control in her life. We reviewed coping skills, self-care, and ways  to set limits regarding rumination.  Therapist validated and normalized Traci Strong feelings and provided supportive empathy. Traci Strong will call prior to her appointment, as needed.   Interventions: CBT and interpersonal  Diagnosis:  Adjustment disorder with mixed anxiety and depressed mood  Treatment Plan:  Client Abilities/Strengths Traci Strong is intelligent, forthcoming, and motivated for change.    Support System: Family and friends.    Client Treatment Preferences Outpatient therapy.   Client Statement of Needs Traci Strong would like to verbalizing feeling towards others, communicate more consistently, reduce avoidance, reduce negative self-talk, think more more positively, highlight strengths and positives for self and others, creating for boundaries for self and others.   Treatment Level Weekly  Symptoms  Anxiety: Worry, rumination,  worry about varying things (finances, getting things done, brother's health), difficulty managing worry, irritability, increased bp & hrt rate, claustrophobia, and history of panic (Status: maintained)  Depression: fluctuating sleep (middle insomnia), lethargy, low motivation, infrequent sadness, infrequent difficulty concentrating, irritability, weight-gain, and guilt  (Status: maintained)  Goals:   Shawanda experiences symptoms of anxiety and depression.    Target Date: 01/05/23 Frequency: Weekly  Progress: 0 Modality: individual    Therapist will provide referrals for additional resources as appropriate.  Therapist will provide psycho-education regarding Traci Strong's diagnosis and corresponding treatment approaches and interventions. Licensed Clinical Social Worker, [place], LCSW will support the patient's ability to achieve the goals identified. will employ CBT, BA, Problem-solving, Solution Focused, Mindfulness,  coping skills, & other evidenced-based practices will be used to promote progress towards healthy functioning to help manage decrease symptoms  associated with her diagnosis.   Reduce  overall level, frequency, and intensity of the feelings of depression, anxiety and interpersonal stress evidenced by decreased overall symptoms from 6 to 7 days/week to 0 to 1 days/week per client report for at least 3 consecutive months. Verbally express understanding of the relationship between feelings of depression, anxiety and their impact on thinking patterns and behaviors. Verbalize an understanding of the role that distorted thinking plays in creating fears, excessive worry, and ruminations.    Traci Strong participated in the creation of the treatment plan)  [name], LCSW

## 2022-04-03 DIAGNOSIS — Z79899 Other long term (current) drug therapy: Secondary | ICD-10-CM | POA: Diagnosis not present

## 2022-04-03 DIAGNOSIS — I10 Essential (primary) hypertension: Secondary | ICD-10-CM | POA: Diagnosis not present

## 2022-04-03 DIAGNOSIS — N1831 Chronic kidney disease, stage 3a: Secondary | ICD-10-CM | POA: Diagnosis not present

## 2022-04-11 DIAGNOSIS — I10 Essential (primary) hypertension: Secondary | ICD-10-CM | POA: Diagnosis not present

## 2022-04-11 DIAGNOSIS — N1831 Chronic kidney disease, stage 3a: Secondary | ICD-10-CM | POA: Diagnosis not present

## 2022-04-16 ENCOUNTER — Ambulatory Visit (INDEPENDENT_AMBULATORY_CARE_PROVIDER_SITE_OTHER): Payer: Medicare PPO | Admitting: Psychology

## 2022-04-16 DIAGNOSIS — F4323 Adjustment disorder with mixed anxiety and depressed mood: Secondary | ICD-10-CM | POA: Diagnosis not present

## 2022-04-16 NOTE — Progress Notes (Signed)
[facility] Counselor/Therapist Progress Note  Patient ID: Traci Strong, MRN: [id number]   Date: 04/16/22  Time Spent: 2:04 pm - 2:50 pm : 47  Minutes  Treatment Type: Individual Therapy.  Reported Symptoms: anxiety and depression.   Mental Status Exam: Appearance:  Well Groomed     Behavior: Appropriate  Motor: Normal  Speech/Language:  Clear and Coherent  Affect: Congruent  Mood: anxious and dysthymic  Thought process: normal  Thought content:   WNL  Sensory/Perceptual disturbances:   WNL  Orientation: oriented to person, place, time/date, and situation  Attention: Good  Concentration: Good  Memory: WNL  Fund of knowledge:  Good  Insight:   Good  Judgment:  Good  Impulse Control: Good   Risk Assessment: Danger to Self:  No Self-injurious Behavior: No Danger to Others: No Duty to Warn:no Physical Aggression / Violence:No  Access to Firearms a concern: No  Gang Involvement:No   Subjective:   Traci Strong Want participated from home, via video and consented to treatment. Therapist participated from office. We met online due to [place] pandemic. Traci Strong reviewed the events of the past week.Traci Strong noted her brother being transferred to skilled nursing home, long-term, due to his declining health and difficulty with simple tasks of mobility. She noted worry that this might be a permanent placement due to his health. We explored this during the session and discussed her feeling regarding this experience and transition. She noted her efforts to engage in self-care consistently including exercise, healthful eating, and stress management. She noted a need for possible adjustments for her sleep. She noted going to sleep early and often waking early. She noted spending time with her family and enjoying this, overall. She is working diligently on improving her health and having follow-ups with her provider. Her goals between sessions include improving sleep and increasing  exercise. Therapist validated and normalized Traci Strong's feelings and experience and provided supportive therapy.   Interventions: CBT and interpersonal  Diagnosis:  Adjustment disorder with mixed anxiety and depressed mood  Treatment Plan:  Client Abilities/Strengths Traci Strong is intelligent, forthcoming, and motivated for change.    Support System: Family and friends.    Client Treatment Preferences Outpatient therapy.   Client Statement of Needs Traci Strong would like to verbalizing feeling towards others, communicate more consistently, reduce avoidance, reduce negative self-talk, think more more positively, highlight strengths and positives for self and others, creating for boundaries for self and others.   Treatment Level Weekly  Symptoms  Anxiety: Worry, rumination,  worry about varying things (finances, getting things done, brother's health), difficulty managing worry, irritability, increased bp & hrt rate, claustrophobia, and history of panic (Status: maintained)  Depression: fluctuating sleep (middle insomnia), lethargy, low motivation, infrequent sadness, infrequent difficulty concentrating, irritability, weight-gain, and guilt  (Status: maintained)  Goals:   Traci Strong experiences symptoms of anxiety and depression.    Target Date: 01/05/23 Frequency: Weekly  Progress: 0 Modality: individual    Therapist will provide referrals for additional resources as appropriate.  Therapist will provide psycho-education regarding Traci Strong's diagnosis and corresponding treatment approaches and interventions. Licensed Clinical Social Worker, [place], LCSW will support the patient's ability to achieve the goals identified. will employ CBT, BA, Problem-solving, Solution Focused, Mindfulness,  coping skills, & other evidenced-based practices will be used to promote progress towards healthy functioning to help manage decrease symptoms associated with her diagnosis.   Reduce overall level, frequency,  and intensity of the feelings of depression, anxiety and interpersonal stress evidenced by decreased overall symptoms  from 6 to 7 days/week to 0 to 1 days/week per client report for at least 3 consecutive months. Verbally express understanding of the relationship between feelings of depression, anxiety and their impact on thinking patterns and behaviors. Verbalize an understanding of the role that distorted thinking plays in creating fears, excessive worry, and ruminations.    Traci Strong participated in the creation of the treatment plan)  [name], LCSW

## 2022-05-17 ENCOUNTER — Ambulatory Visit (INDEPENDENT_AMBULATORY_CARE_PROVIDER_SITE_OTHER): Payer: Medicare PPO | Admitting: Psychology

## 2022-05-17 DIAGNOSIS — F4323 Adjustment disorder with mixed anxiety and depressed mood: Secondary | ICD-10-CM | POA: Diagnosis not present

## 2022-05-17 NOTE — Progress Notes (Signed)
[facility] Counselor/Therapist Progress Note  Patient ID: Traci Strong, MRN: [id number]   Date: 05/17/22  Time Spent: 10:03 am - 10:53 am : 50 Minutes  Treatment Type: Individual Therapy.  Reported Symptoms: anxiety and depression.   Mental Status Exam: Appearance:  Well Groomed     Behavior: Appropriate  Motor: Normal  Speech/Language:  Clear and Coherent  Affect: Congruent  Mood: anxious and dysthymic  Thought process: normal  Thought content:   WNL  Sensory/Perceptual disturbances:   WNL  Orientation: oriented to person, place, time/date, and situation  Attention: Good  Concentration: Good  Memory: WNL  Fund of knowledge:  Good  Insight:   Good  Judgment:  Good  Impulse Control: Good   Risk Assessment: Danger to Self:  No Self-injurious Behavior: No Danger to Others: No Duty to Warn:no Physical Aggression / Violence:No  Access to Firearms a concern: No  Gang Involvement:No   Subjective:   Ellene Bloodsaw Baillargeon participated from home, via video and consented to treatment. Therapist participated from office. We met online due to [place] pandemic. Zyann reviewed the events of the past week. Amarys endorsed anticipatory grief in relation to her brother's declining health. She her brother's declining functioning and ability to ambulate. She noted the stressors of recognizing that he will no longer be able to engage in activities and that the most previous visit to church is likely his last. She noted drastic weight-loss for her brother, who is in hospice. She noted feelings in relation to "making the right decisions". We explored this, during the session, and therapist normalized these feelings while challenging them via evidence of the situation. Therapist encouraged Tram to work on identifying the facts related to the decision to engage with hospice and identifying the feelings in related the initial feelings of "making the right decisions". She noted her efforts to  consistently engage in socializing. She noted suspecting the possibility of being ADHD due to jumping from one task to another. She noted often receiving reports at school that she "talked too much". Therapist provided the ASRS V1.1 to be completed prior to our follow-up. Therapist provided psycho-education regarding the measure and purpose of. Additionally, therapist provided Cornisha a Balance Wheel to identify areas of concentration going forward in regards to day-to-day life.Therapist validated and normalized Lorrena's feelings and experience and provided supportive therapy.   Interventions: CBT and interpersonal  Diagnosis:  Adjustment disorder with mixed anxiety and depressed mood  Treatment Plan:  Client Abilities/Strengths Aitanna is intelligent, forthcoming, and motivated for change.    Support System: Family and friends.    Client Treatment Preferences Outpatient therapy.   Client Statement of Needs Berlie would like to verbalizing feeling towards others, communicate more consistently, reduce avoidance, reduce negative self-talk, think more more positively, highlight strengths and positives for self and others, creating for boundaries for self and others.   Treatment Level Weekly  Symptoms  Anxiety: Worry, rumination,  worry about varying things (finances, getting things done, brother's health), difficulty managing worry, irritability, increased bp & hrt rate, claustrophobia, and history of panic (Status: maintained)  Depression: fluctuating sleep (middle insomnia), lethargy, low motivation, infrequent sadness, infrequent difficulty concentrating, irritability, weight-gain, and guilt  (Status: maintained)  Goals:   Eulalia experiences symptoms of anxiety and depression.    Target Date: 01/05/23 Frequency: Weekly  Progress: 0 Modality: individual    Therapist will provide referrals for additional resources as appropriate.  Therapist will provide psycho-education regarding Sigrid's  diagnosis and corresponding treatment approaches and  interventions. Licensed Clinical Social Worker, [place], LCSW will support the patient's ability to achieve the goals identified. will employ CBT, BA, Problem-solving, Solution Focused, Mindfulness,  coping skills, & other evidenced-based practices will be used to promote progress towards healthy functioning to help manage decrease symptoms associated with her diagnosis.   Reduce overall level, frequency, and intensity of the feelings of depression, anxiety and interpersonal stress evidenced by decreased overall symptoms from 6 to 7 days/week to 0 to 1 days/week per client report for at least 3 consecutive months. Verbally express understanding of the relationship between feelings of depression, anxiety and their impact on thinking patterns and behaviors. Verbalize an understanding of the role that distorted thinking plays in creating fears, excessive worry, and ruminations.    [name] participated in the creation of the treatment plan)  [name], LCSW

## 2022-06-06 DIAGNOSIS — J019 Acute sinusitis, unspecified: Secondary | ICD-10-CM | POA: Diagnosis not present

## 2022-06-14 ENCOUNTER — Ambulatory Visit (INDEPENDENT_AMBULATORY_CARE_PROVIDER_SITE_OTHER): Payer: Medicare PPO | Admitting: Psychology

## 2022-06-14 DIAGNOSIS — F4323 Adjustment disorder with mixed anxiety and depressed mood: Secondary | ICD-10-CM

## 2022-06-14 NOTE — Progress Notes (Signed)
[facility] Counselor/Therapist Progress Note  Patient ID: CLOTIEL LOLLIS, MRN: [id number]   Date: 06/14/22  Time Spent: 10:02 am - 10:51 am : 59 Minutes  Treatment Type: Individual Therapy.  Reported Symptoms: anxiety and depression.   Mental Status Exam: Appearance:  Well Groomed     Behavior: Appropriate  Motor: Normal  Speech/Language:  Clear and Coherent  Affect: Congruent  Mood: normal  Thought process: normal  Thought content:   WNL  Sensory/Perceptual disturbances:   WNL  Orientation: oriented to person, place, time/date, and situation  Attention: Good  Concentration: Good  Memory: WNL  Fund of knowledge:  Good  Insight:   Good  Judgment:  Good  Impulse Control: Good   Risk Assessment: Danger to Self:  No Self-injurious Behavior: No Danger to Others: No Duty to Warn:no Physical Aggression / Violence:No  Access to Firearms a concern: No  Gang Involvement:No   Subjective:   Jezelle Partipilo [place] participated from home, via video and consented to treatment. Therapist participated from home office. We met online due to [place] pandemic. Katianna reviewed the events of the past week.She noted some improvement in her brother's mood during his stay in hospice. She is working on participating in the caregiver cancer group. Therapist praised Shanna for her efforts to seek social support consistently. She noted outgrowing her previous support group, a bereavement group, due to the progress she has made. She noted her efforts to focus on enjoyable and memorable times with her deceased partner, Clair Gulling. She noted working on being more social and accepting an invite from a friend, recently, to continue engagement. She noted stressors related to past events at church and noted her feelings in relation to this. We reviewed the results of her ASRS v1.1, which were transposed below. The screening was positive. We discussed the limits of this measure noting that the measure does not provide  a diagnosis but that a positive screening. She provided additional background regarding her symptoms including talking in school often (having to copy the dictionary as punishment), boundless energy, in childhood, prior to the age of 51. She denied hyperactivity in the academic setting due to worry of discipline and punishment. She noted being bored easily and having to switch tasks due to boredom. Therapist  encouraged Kaylonni to identify the day-to-day effects of these symptoms on her day-to-day functioning. We will discuss possible treatment approaches for her symptoms going forward. We reviewed the balance wheel assignment. Therapist validated and normalized Emanuel's feelings and experience and provided supportive therapy.      ASRS V1.1  Part-A 1. How often do you have trouble wrapping up the final details of a project,     once the challenging parts have been done? Sometimes 2. How often do you have difficulty getting things in order when you have to do     a task that requires organization? Often 3. How often do you have problems remembering appointments or obligations? Rarely 4. When you have a task that requires a lot of thought, how often do you avoid     or delay getting started? Often 5. How often do you fidget or squirm with your hands or feet when you have     to sit down for a long time? Often 6. How often do you feel overly active and compelled to do things, like you     were driven by a motor? Rarely  Part-B 7. How often do you make careless mistakes when you have to  work on a boring or     difficult project? Rarely 8. How often do you have difficulty keeping your attention when you are doing boring     or repetitive work? Sometimes 9. How often do you have difficulty concentrating on what people say to you,      even when they are speaking to you directly? Sometimes 10. How often do you misplace or have difficulty finding things at home or at work? Often 11. How often are  you distracted by activity or noise around you? Often 12. How often do you leave your seat in meetings or other situations in which       you are expected to remain seated? Rarely 13. How often do you feel restless or fidgety? Sometimes 14. How often do you have difficulty unwinding and relaxing when you have time       to yourself? Sometimes 15. How often do you find yourself talking too much when you are in social situations? Sometimes 16. When you're in a conversation, how often do you find yourself finishing           the sentences of the people you are talking to, before they can finish       them themselves? Sometimes 17. How often do you have difficulty waiting your turn in situations when       turn taking is required? Sometimes 18. How often do you interrupt others when they are busy? Sometimes   Interventions: CBT and Psychoeducational  Diagnosis:  Adjustment disorder with mixed anxiety and depressed mood  Treatment Plan:  Client Abilities/Strengths Kenzleigh is intelligent, forthcoming, and motivated for change.    Support System: Family and friends.    Client Treatment Preferences Outpatient therapy.   Client Statement of Needs Tynasia would like to verbalizing feeling towards others, communicate more consistently, reduce avoidance, reduce negative self-talk, think more more positively, highlight strengths and positives for self and others, creating for boundaries for self and others.   Treatment Level Weekly  Symptoms  Anxiety: Worry, rumination,  worry about varying things (finances, getting things done, brother's health), difficulty managing worry, irritability, increased bp & hrt rate, claustrophobia, and history of panic (Status: maintained)  Depression: fluctuating sleep (middle insomnia), lethargy, low motivation, infrequent sadness, infrequent difficulty concentrating, irritability, weight-gain, and guilt  (Status: maintained)  Goals:   Leella experiences  symptoms of anxiety and depression.    Target Date: 01/05/23 Frequency: Weekly  Progress: 0 Modality: individual    Therapist will provide referrals for additional resources as appropriate.  Therapist will provide psycho-education regarding Ladan's diagnosis and corresponding treatment approaches and interventions. Licensed Clinical Social Worker, [place], LCSW will support the patient's ability to achieve the goals identified. will employ CBT, BA, Problem-solving, Solution Focused, Mindfulness,  coping skills, & other evidenced-based practices will be used to promote progress towards healthy functioning to help manage decrease symptoms associated with her diagnosis.   Reduce overall level, frequency, and intensity of the feelings of depression, anxiety and interpersonal stress evidenced by decreased overall symptoms from 6 to 7 days/week to 0 to 1 days/week per client report for at least 3 consecutive months. Verbally express understanding of the relationship between feelings of depression, anxiety and their impact on thinking patterns and behaviors. Verbalize an understanding of the role that distorted thinking plays in creating fears, excessive worry, and ruminations.    [name] participated in the creation of the treatment plan)  [name], LCSW

## 2022-06-28 ENCOUNTER — Encounter: Payer: Self-pay | Admitting: Obstetrics & Gynecology

## 2022-06-28 ENCOUNTER — Ambulatory Visit (INDEPENDENT_AMBULATORY_CARE_PROVIDER_SITE_OTHER): Payer: Medicare PPO | Admitting: Obstetrics & Gynecology

## 2022-06-28 VITALS — BP 108/78 | HR 65 | Ht 60.25 in | Wt 166.0 lb

## 2022-06-28 DIAGNOSIS — M8589 Other specified disorders of bone density and structure, multiple sites: Secondary | ICD-10-CM

## 2022-06-28 DIAGNOSIS — Z01419 Encounter for gynecological examination (general) (routine) without abnormal findings: Secondary | ICD-10-CM | POA: Diagnosis not present

## 2022-06-28 DIAGNOSIS — Z780 Asymptomatic menopausal state: Secondary | ICD-10-CM

## 2022-06-28 MED ORDER — RISEDRONATE SODIUM 150 MG PO TABS: 150.0000 mg | ORAL_TABLET | ORAL | 4 refills | Status: DC

## 2022-06-28 NOTE — Progress Notes (Signed)
Traci Strong 05-29-1955 [id number]   History:    67 y.o.  G1P1L1 Divorced.  Stable companion passed away 2 yrs ago.  Brother with advanced Thyroid Cancer in hospice.  Daughter had a baby in 03/2021, moved to [place].   RP: Established patient presenting for annual gyn exam    HPI: Postmenopause, well on no HRT.  No PMB.  No pelvic pain.  Abstinent.  Pap Neg 11/2019.  Urine/BMs normal.  Breasts normal.  Mammo Neg 01/2022. BMI 32.15. Walking.  Health labs with [facility].  H/O Osteoporosis on Actonel. BD Osteopenia -1.6 stable in 01/2022.    Past medical history,surgical history, family history and social history were all reviewed and documented in the EPIC chart.  Gynecologic History No LMP recorded. Patient is postmenopausal.  Obstetric History OB History  Gravida Para Term Preterm AB Living  '1 1 1     1  '$ SAB IAB Ectopic Multiple Live Births               # Outcome Date GA Lbr Len/2nd Weight Sex Delivery Anes PTL Lv  1 Term              ROS: A ROS was performed and pertinent positives and negatives are included in the history. GENERAL: No fevers or chills. HEENT: No change in vision, no earache, sore throat or sinus congestion. NECK: No pain or stiffness. CARDIOVASCULAR: No chest pain or pressure. No palpitations. PULMONARY: No shortness of breath, cough or wheeze. GASTROINTESTINAL: No abdominal pain, nausea, vomiting or diarrhea, melena or bright red blood per rectum. GENITOURINARY: No urinary frequency, urgency, hesitancy or dysuria. MUSCULOSKELETAL: No joint or muscle pain, no back pain, no recent trauma. DERMATOLOGIC: No rash, no itching, no lesions. ENDOCRINE: No polyuria, polydipsia, no heat or cold intolerance. No recent change in weight. HEMATOLOGICAL: No anemia or easy bruising or bleeding. NEUROLOGIC: No headache, seizures, numbness, tingling or weakness. PSYCHIATRIC: No depression, no loss of interest in normal activity or change in sleep pattern.      Exam:   BP 108/78   Pulse 65   Ht 5' 0.25" (1.53 m)   Wt 166 lb (75.3 kg)   SpO2 98%   BMI 32.15 kg/m   Body mass index is 32.15 kg/m.  General appearance : Well developed well nourished female. No acute distress HEENT: Eyes: no retinal hemorrhage or exudates,  Neck supple, trachea midline, no carotid bruits, no thyroidmegaly Lungs: Clear to auscultation, no rhonchi or wheezes, or rib retractions  Heart: Regular rate and rhythm, no murmurs or gallops Breast:Examined in sitting and supine position were symmetrical in appearance, no palpable masses or tenderness,  no skin retraction, no nipple inversion, no nipple discharge, no skin discoloration, no axillary or supraclavicular lymphadenopathy Abdomen: no palpable masses or tenderness, no rebound or guarding Extremities: no edema or skin discoloration or tenderness  Pelvic: Vulva: Normal             Vagina: No gross lesions or discharge  Cervix: No gross lesions or discharge  Uterus  AV, normal size, shape and consistency, non-tender and mobile  Adnexa  Without masses or tenderness  Anus: Normal   Assessment/Plan:  67 y.o. female for annual exam   1. Well female exam with routine gynecological exam Postmenopause, well on no HRT.  No PMB.  No pelvic pain. Abstinent.  Pap Neg 11/2019.  Urine/BMs normal.  Breasts normal.  Mammo Neg 01/2022. BMI 32.15. Walking.  Health labs with  Fam MD.  Cologuard Neg 2020.  H/O Osteoporosis on Actonel. BD Osteopenia -1.6 stable in 01/2022.  2. Postmenopause Postmenopause, well on no HRT.  No PMB.  No pelvic pain. Abstinent.    3. Osteopenia of multiple sites BMI 32.15. Walking.  Health labs with Fam MD.  H/O Osteoporosis on Actonel. BD Osteopenia -1.6 stable in 01/2022.  Other orders - amLODipine (NORVASC) 2.5 MG tablet; Take 2.5 mg by mouth daily. - risedronate (ACTONEL) 150 MG tablet; Take 1 tablet (150 mg total) by mouth every 30 (thirty) days. with water on empty stomach, nothing by mouth  or lie down for next 30 minutes.   Princess Bruins MD, 9:05 AM

## 2022-07-12 ENCOUNTER — Ambulatory Visit (INDEPENDENT_AMBULATORY_CARE_PROVIDER_SITE_OTHER): Payer: Medicare PPO | Admitting: Psychology

## 2022-07-12 DIAGNOSIS — F4323 Adjustment disorder with mixed anxiety and depressed mood: Secondary | ICD-10-CM | POA: Diagnosis not present

## 2022-07-12 NOTE — Progress Notes (Signed)
[facility] Counselor/Therapist Progress Note  Patient ID: Traci Strong, MRN: [id number]   Date: 07/12/22  Time Spent: 2:02 pm - 2:48 pm : 39 Minutes  Treatment Type: Individual Therapy.  Reported Symptoms: anxiety and depression.   Mental Status Exam: Appearance:  Well Groomed     Behavior: Appropriate  Motor: Normal  Speech/Language:  Clear and Coherent  Affect: Congruent  Mood: normal  Thought process: normal  Thought content:   WNL  Sensory/Perceptual disturbances:   WNL  Orientation: oriented to person, place, time/date, and situation  Attention: Good  Concentration: Good  Memory: WNL  Fund of knowledge:  Good  Insight:   Good  Judgment:  Good  Impulse Control: Good   Risk Assessment: Danger to Self:  No Self-injurious Behavior: No Danger to Others: No Duty to Warn:no Physical Aggression / Violence:No  Access to Firearms a concern: No  Gang Involvement:No   Subjective:   Traci Strong participated from home, via video and consented to treatment. Therapist participated from home office. We met online due to [place] pandemic. Traci Strong reviewed the events of the past week. Traci Strong noted her increasing anxiety due to finances. She noted unforseen financial bills that have to be addressed, as well. She continues to socialize regularly and attends a cancer care-giver support group. She noted some worry regarding her brother's health but noted that he is currently in a good mood during his hospice placement. She noted her intent to get more exercise, outdoors, due to the arrival of spring. She noted taking a break from the bereavement group due to her schedule and noted having made progress in the past. She noted her daughter being pregnant and Traci Strong's own excitement of this news. She noted working on the balance wheel of life and discussed attempting to get more balance in her day-to-day. Therapist praised Traci Strong's work in this area. Therapist encouraged continued  effort and focus in this area. Therapist validated and normalized Traci Strong's feelings and experience and provided supportive therapy.   Interventions: CBT and Psychoeducational  Diagnosis:  Adjustment disorder with mixed anxiety and depressed mood  Treatment Plan:  Client Abilities/Strengths Traci Strong is intelligent, forthcoming, and motivated for change.    Support System: Family and friends.    Client Treatment Preferences Outpatient therapy.   Client Statement of Needs Traci Strong would like to verbalizing feeling towards others, communicate more consistently, reduce avoidance, reduce negative self-talk, think more more positively, highlight strengths and positives for self and others, creating for boundaries for self and others.   Treatment Level Weekly  Symptoms  Anxiety: Worry, rumination,  worry about varying things (finances, getting things done, brother's health), difficulty managing worry, irritability, increased bp & hrt rate, claustrophobia, and history of panic (Status: maintained)  Depression: fluctuating sleep (middle insomnia), lethargy, low motivation, infrequent sadness, infrequent difficulty concentrating, irritability, weight-gain, and guilt  (Status: maintained)  Goals:   Traci Strong experiences symptoms of anxiety and depression.    Target Date: 01/05/23 Frequency: Weekly  Progress: 0 Modality: individual    Therapist will provide referrals for additional resources as appropriate.  Therapist will provide psycho-education regarding Jerrye's diagnosis and corresponding treatment approaches and interventions. Licensed Clinical Social Worker, [place], LCSW will support the patient's ability to achieve the goals identified. will employ CBT, BA, Problem-solving, Solution Focused, Mindfulness,  coping skills, & other evidenced-based practices will be used to promote progress towards healthy functioning to help manage decrease symptoms associated with her diagnosis.   Reduce  overall level, frequency, and intensity  of the feelings of depression, anxiety and interpersonal stress evidenced by decreased overall symptoms from 6 to 7 days/week to 0 to 1 days/week per client report for at least 3 consecutive months. Verbally express understanding of the relationship between feelings of depression, anxiety and their impact on thinking patterns and behaviors. Verbalize an understanding of the role that distorted thinking plays in creating fears, excessive worry, and ruminations.    Traci Strong participated in the creation of the treatment plan)  [name], LCSW

## 2022-07-23 DIAGNOSIS — H52203 Unspecified astigmatism, bilateral: Secondary | ICD-10-CM | POA: Diagnosis not present

## 2022-07-23 DIAGNOSIS — H524 Presbyopia: Secondary | ICD-10-CM | POA: Diagnosis not present

## 2022-07-23 DIAGNOSIS — H25813 Combined forms of age-related cataract, bilateral: Secondary | ICD-10-CM | POA: Diagnosis not present

## 2022-07-23 DIAGNOSIS — H5212 Myopia, left eye: Secondary | ICD-10-CM | POA: Diagnosis not present

## 2022-07-23 DIAGNOSIS — H5201 Hypermetropia, right eye: Secondary | ICD-10-CM | POA: Diagnosis not present

## 2022-08-09 ENCOUNTER — Ambulatory Visit (INDEPENDENT_AMBULATORY_CARE_PROVIDER_SITE_OTHER): Payer: Medicare PPO | Admitting: Psychology

## 2022-08-09 DIAGNOSIS — F4323 Adjustment disorder with mixed anxiety and depressed mood: Secondary | ICD-10-CM

## 2022-08-09 NOTE — Progress Notes (Signed)
[facility] Counselor/Therapist Progress Note  Patient ID: JELEESA TIERNAN, MRN: [id number]   Date: 08/09/22  Time Spent: 1:03 pm - 1:49 pm : 46 Minutes  Treatment Type: Individual Therapy.  Reported Symptoms: anxiety and depression.   Mental Status Exam: Appearance:  Well Groomed     Behavior: Appropriate  Motor: Normal  Speech/Language:  Clear and Coherent  Affect: Congruent  Mood: normal  Thought process: normal  Thought content:   WNL  Sensory/Perceptual disturbances:   WNL  Orientation: oriented to person, place, time/date, and situation  Attention: Good  Concentration: Good  Memory: WNL  Fund of knowledge:  Good  Insight:   Good  Judgment:  Good  Impulse Control: Good   Risk Assessment: Danger to Self:  No Self-injurious Behavior: No Danger to Others: No Duty to Warn:no Physical Aggression / Violence:No  Access to Firearms a concern: No  Gang Involvement:No   Subjective:   Aydrian Boullion Artola participated from home, via video and consented to treatment. Therapist participated from home office. We met online due to [place] pandemic. Tamyla reviewed the events of the past week. She noted working on engaging in social activities and noted recently attending a concert and plans for additional concerts. She noted some social anxiety when thinking about engaging socially but noted this subsides shortly after. She noted her brother doing well, overall, and defying the odds of his medical prognoses. She noted the unexpected loss of a previous student due to medical issues. She noted anxiety regarding her brother's finances as she navigates his benefits. We explored this during the session. She noted an increase in her overall financial stressors and noted a need to manage this proactively. She noted receiving social support. She noted rumination regarding decision-making regarding her brother's health which is affecting her sleep. We explored this during the session. She  reflected back on her mother's heath decline and the difficulties related to. Therapist validated Kailynn's feelings and experience, provide supportive therapy, and encouraged self-care and boundaries. She noted a need to lose weight and exercise more consistently. Therapist encouraged continued effort and focus in this area. Therapist provided supportive therapy.   Interventions: CBT and interpersonal.  Diagnosis:  Adjustment disorder with mixed anxiety and depressed mood  Treatment Plan:  Client Abilities/Strengths Evony is intelligent, forthcoming, and motivated for change.    Support System: Family and friends.    Client Treatment Preferences Outpatient therapy.   Client Statement of Needs Vaunda would like to verbalizing feeling towards others, communicate more consistently, reduce avoidance, reduce negative self-talk, think more more positively, highlight strengths and positives for self and others, creating for boundaries for self and others.   Treatment Level Weekly  Symptoms  Anxiety: Worry, rumination,  worry about varying things (finances, getting things done, brother's health), difficulty managing worry, irritability, increased bp & hrt rate, claustrophobia, and history of panic (Status: maintained)  Depression: fluctuating sleep (middle insomnia), lethargy, low motivation, infrequent sadness, infrequent difficulty concentrating, irritability, weight-gain, and guilt  (Status: maintained)  Goals:   Torria experiences symptoms of anxiety and depression.    Target Date: 01/05/23 Frequency: Weekly  Progress: 0 Modality: individual    Therapist will provide referrals for additional resources as appropriate.  Therapist will provide psycho-education regarding Areil's diagnosis and corresponding treatment approaches and interventions. Licensed Clinical Social Worker, [place], LCSW will support the patient's ability to achieve the goals identified. will employ CBT, BA,  Problem-solving, Solution Focused, Mindfulness,  coping skills, & other evidenced-based practices will be  used to promote progress towards healthy functioning to help manage decrease symptoms associated with her diagnosis.   Reduce overall level, frequency, and intensity of the feelings of depression, anxiety and interpersonal stress evidenced by decreased overall symptoms from 6 to 7 days/week to 0 to 1 days/week per client report for at least 3 consecutive months. Verbally express understanding of the relationship between feelings of depression, anxiety and their impact on thinking patterns and behaviors. Verbalize an understanding of the role that distorted thinking plays in creating fears, excessive worry, and ruminations.    [name] participated in the creation of the treatment plan)  [name], LCSW

## 2022-08-22 DIAGNOSIS — M9903 Segmental and somatic dysfunction of lumbar region: Secondary | ICD-10-CM | POA: Diagnosis not present

## 2022-08-22 DIAGNOSIS — M9901 Segmental and somatic dysfunction of cervical region: Secondary | ICD-10-CM | POA: Diagnosis not present

## 2022-08-22 DIAGNOSIS — M62830 Muscle spasm of back: Secondary | ICD-10-CM | POA: Diagnosis not present

## 2022-08-22 DIAGNOSIS — M9902 Segmental and somatic dysfunction of thoracic region: Secondary | ICD-10-CM | POA: Diagnosis not present

## 2022-08-22 DIAGNOSIS — M546 Pain in thoracic spine: Secondary | ICD-10-CM | POA: Diagnosis not present

## 2022-08-22 DIAGNOSIS — M542 Cervicalgia: Secondary | ICD-10-CM | POA: Diagnosis not present

## 2022-08-31 DIAGNOSIS — M9903 Segmental and somatic dysfunction of lumbar region: Secondary | ICD-10-CM | POA: Diagnosis not present

## 2022-08-31 DIAGNOSIS — M546 Pain in thoracic spine: Secondary | ICD-10-CM | POA: Diagnosis not present

## 2022-08-31 DIAGNOSIS — M542 Cervicalgia: Secondary | ICD-10-CM | POA: Diagnosis not present

## 2022-08-31 DIAGNOSIS — M9902 Segmental and somatic dysfunction of thoracic region: Secondary | ICD-10-CM | POA: Diagnosis not present

## 2022-08-31 DIAGNOSIS — M9901 Segmental and somatic dysfunction of cervical region: Secondary | ICD-10-CM | POA: Diagnosis not present

## 2022-08-31 DIAGNOSIS — M62830 Muscle spasm of back: Secondary | ICD-10-CM | POA: Diagnosis not present

## 2022-09-03 DIAGNOSIS — M9903 Segmental and somatic dysfunction of lumbar region: Secondary | ICD-10-CM | POA: Diagnosis not present

## 2022-09-03 DIAGNOSIS — M9901 Segmental and somatic dysfunction of cervical region: Secondary | ICD-10-CM | POA: Diagnosis not present

## 2022-09-03 DIAGNOSIS — M9902 Segmental and somatic dysfunction of thoracic region: Secondary | ICD-10-CM | POA: Diagnosis not present

## 2022-09-03 DIAGNOSIS — M546 Pain in thoracic spine: Secondary | ICD-10-CM | POA: Diagnosis not present

## 2022-09-03 DIAGNOSIS — M62830 Muscle spasm of back: Secondary | ICD-10-CM | POA: Diagnosis not present

## 2022-09-03 DIAGNOSIS — M542 Cervicalgia: Secondary | ICD-10-CM | POA: Diagnosis not present

## 2022-09-06 DIAGNOSIS — M542 Cervicalgia: Secondary | ICD-10-CM | POA: Diagnosis not present

## 2022-09-06 DIAGNOSIS — M9901 Segmental and somatic dysfunction of cervical region: Secondary | ICD-10-CM | POA: Diagnosis not present

## 2022-09-06 DIAGNOSIS — M9902 Segmental and somatic dysfunction of thoracic region: Secondary | ICD-10-CM | POA: Diagnosis not present

## 2022-09-06 DIAGNOSIS — M9903 Segmental and somatic dysfunction of lumbar region: Secondary | ICD-10-CM | POA: Diagnosis not present

## 2022-09-06 DIAGNOSIS — M62830 Muscle spasm of back: Secondary | ICD-10-CM | POA: Diagnosis not present

## 2022-09-06 DIAGNOSIS — M546 Pain in thoracic spine: Secondary | ICD-10-CM | POA: Diagnosis not present

## 2022-09-07 ENCOUNTER — Ambulatory Visit (INDEPENDENT_AMBULATORY_CARE_PROVIDER_SITE_OTHER): Payer: Medicare PPO | Admitting: Psychology

## 2022-09-07 DIAGNOSIS — F4323 Adjustment disorder with mixed anxiety and depressed mood: Secondary | ICD-10-CM | POA: Diagnosis not present

## 2022-09-07 NOTE — Progress Notes (Signed)
[facility] Counselor/Therapist Progress Note  Patient ID: Traci Strong, MRN: [id number]   Date: 09/07/22  Time Spent: 12:04 pm - 12:51 pm : 47 Minutes  Treatment Type: Individual Therapy.  Reported Symptoms: anxiety and depression.   Mental Status Exam: Appearance:  Well Groomed     Behavior: Appropriate  Motor: Normal  Speech/Language:  Clear and Coherent  Affect: Congruent  Mood: normal  Thought process: normal  Thought content:   WNL  Sensory/Perceptual disturbances:   WNL  Orientation: oriented to person, place, time/date, and situation  Attention: Good  Concentration: Good  Memory: WNL  Fund of knowledge:  Good  Insight:   Good  Judgment:  Good  Impulse Control: Good   Risk Assessment: Danger to Self:  No Self-injurious Behavior: No Danger to Others: No Duty to Warn:no Physical Aggression / Violence:No  Access to Firearms a concern: No  Gang Involvement:No   Subjective:   Traci Strong participated from home, via video and consented to treatment. Therapist participated from home office. We met online due to COVID pandemic. [name] reviewed the events of the past week. She noted having back issues and being limited in regards to completing simple tasks but noted getting treatment. She noted frustration regarding pressure from her friend to make a specific decision in Traci Strong's own life. We worked on processing her feelings regarding this and the pressure she experiencing from this friend. We worked on identifying ways to set and communicate boundaries. She noted that it has been bothering her to be limited physically and not having control. She noted interest in being social once her back improves. Therapist encouraged self-care and setting boundaries for others. Therapist encouraged continued effort and focus in this area. Therapist provided supportive therapy.   Interventions: CBT and interpersonal.  Diagnosis:  Adjustment disorder with mixed anxiety  and depressed mood  Treatment Plan:  Client Abilities/Strengths [name] is intelligent, forthcoming, and motivated for change.    Support System: Family and friends.    Client Treatment Preferences Outpatient therapy.   Client Statement of Needs [name] would like to verbalizing feeling towards others, communicate more consistently, reduce avoidance, reduce negative self-talk, think more more positively, highlight strengths and positives for self and others, creating for boundaries for self and others.   Treatment Level Weekly  Symptoms  Anxiety: Worry, rumination,  worry about varying things (finances, getting things done, brother's health), difficulty managing worry, irritability, increased bp & hrt rate, claustrophobia, and history of panic (Status: maintained)  Depression: fluctuating sleep (middle insomnia), lethargy, low motivation, infrequent sadness, infrequent difficulty concentrating, irritability, weight-gain, and guilt  (Status: maintained)  Goals:   Appie experiences symptoms of anxiety and depression.    Target Date: 01/05/23 Frequency: Weekly  Progress: 0 Modality: individual    Therapist will provide referrals for additional resources as appropriate.  Therapist will provide psycho-education regarding Traci Strong's diagnosis and corresponding treatment approaches and interventions. Licensed Clinical Social Worker, [place], LCSW will support the patient's ability to achieve the goals identified. will employ CBT, BA, Problem-solving, Solution Focused, Mindfulness,  coping skills, & other evidenced-based practices will be used to promote progress towards healthy functioning to help manage decrease symptoms associated with her diagnosis.   Reduce overall level, frequency, and intensity of the feelings of depression, anxiety and interpersonal stress evidenced by decreased overall symptoms from 6 to 7 days/week to 0 to 1 days/week per client report for at least 3 consecutive  months. Verbally express understanding of the relationship between feelings of  depression, anxiety and their impact on thinking patterns and behaviors. Verbalize an understanding of the role that distorted thinking plays in creating fears, excessive worry, and ruminations.    [name] participated in the creation of the treatment plan)  [name], LCSW

## 2022-09-12 DIAGNOSIS — M9902 Segmental and somatic dysfunction of thoracic region: Secondary | ICD-10-CM | POA: Diagnosis not present

## 2022-09-12 DIAGNOSIS — M62830 Muscle spasm of back: Secondary | ICD-10-CM | POA: Diagnosis not present

## 2022-09-12 DIAGNOSIS — M9901 Segmental and somatic dysfunction of cervical region: Secondary | ICD-10-CM | POA: Diagnosis not present

## 2022-09-12 DIAGNOSIS — M542 Cervicalgia: Secondary | ICD-10-CM | POA: Diagnosis not present

## 2022-09-12 DIAGNOSIS — M9903 Segmental and somatic dysfunction of lumbar region: Secondary | ICD-10-CM | POA: Diagnosis not present

## 2022-09-12 DIAGNOSIS — M546 Pain in thoracic spine: Secondary | ICD-10-CM | POA: Diagnosis not present

## 2022-09-19 DIAGNOSIS — M9903 Segmental and somatic dysfunction of lumbar region: Secondary | ICD-10-CM | POA: Diagnosis not present

## 2022-09-19 DIAGNOSIS — M9902 Segmental and somatic dysfunction of thoracic region: Secondary | ICD-10-CM | POA: Diagnosis not present

## 2022-09-19 DIAGNOSIS — M62830 Muscle spasm of back: Secondary | ICD-10-CM | POA: Diagnosis not present

## 2022-09-19 DIAGNOSIS — M9901 Segmental and somatic dysfunction of cervical region: Secondary | ICD-10-CM | POA: Diagnosis not present

## 2022-09-19 DIAGNOSIS — M546 Pain in thoracic spine: Secondary | ICD-10-CM | POA: Diagnosis not present

## 2022-09-19 DIAGNOSIS — M542 Cervicalgia: Secondary | ICD-10-CM | POA: Diagnosis not present

## 2022-09-26 DIAGNOSIS — M62830 Muscle spasm of back: Secondary | ICD-10-CM | POA: Diagnosis not present

## 2022-09-26 DIAGNOSIS — M542 Cervicalgia: Secondary | ICD-10-CM | POA: Diagnosis not present

## 2022-09-26 DIAGNOSIS — M9901 Segmental and somatic dysfunction of cervical region: Secondary | ICD-10-CM | POA: Diagnosis not present

## 2022-09-26 DIAGNOSIS — M546 Pain in thoracic spine: Secondary | ICD-10-CM | POA: Diagnosis not present

## 2022-09-26 DIAGNOSIS — M9903 Segmental and somatic dysfunction of lumbar region: Secondary | ICD-10-CM | POA: Diagnosis not present

## 2022-09-26 DIAGNOSIS — M9902 Segmental and somatic dysfunction of thoracic region: Secondary | ICD-10-CM | POA: Diagnosis not present

## 2022-10-02 DIAGNOSIS — Z79899 Other long term (current) drug therapy: Secondary | ICD-10-CM | POA: Diagnosis not present

## 2022-10-02 DIAGNOSIS — G43909 Migraine, unspecified, not intractable, without status migrainosus: Secondary | ICD-10-CM | POA: Diagnosis not present

## 2022-10-02 DIAGNOSIS — N1831 Chronic kidney disease, stage 3a: Secondary | ICD-10-CM | POA: Diagnosis not present

## 2022-10-02 DIAGNOSIS — M1000 Idiopathic gout, unspecified site: Secondary | ICD-10-CM | POA: Diagnosis not present

## 2022-10-02 DIAGNOSIS — M810 Age-related osteoporosis without current pathological fracture: Secondary | ICD-10-CM | POA: Diagnosis not present

## 2022-10-02 DIAGNOSIS — I10 Essential (primary) hypertension: Secondary | ICD-10-CM | POA: Diagnosis not present

## 2022-10-03 DIAGNOSIS — M9901 Segmental and somatic dysfunction of cervical region: Secondary | ICD-10-CM | POA: Diagnosis not present

## 2022-10-03 DIAGNOSIS — M546 Pain in thoracic spine: Secondary | ICD-10-CM | POA: Diagnosis not present

## 2022-10-03 DIAGNOSIS — M62830 Muscle spasm of back: Secondary | ICD-10-CM | POA: Diagnosis not present

## 2022-10-03 DIAGNOSIS — M9903 Segmental and somatic dysfunction of lumbar region: Secondary | ICD-10-CM | POA: Diagnosis not present

## 2022-10-03 DIAGNOSIS — M9902 Segmental and somatic dysfunction of thoracic region: Secondary | ICD-10-CM | POA: Diagnosis not present

## 2022-10-03 DIAGNOSIS — M542 Cervicalgia: Secondary | ICD-10-CM | POA: Diagnosis not present

## 2022-10-09 DIAGNOSIS — N1831 Chronic kidney disease, stage 3a: Secondary | ICD-10-CM | POA: Diagnosis not present

## 2022-10-09 DIAGNOSIS — I10 Essential (primary) hypertension: Secondary | ICD-10-CM | POA: Diagnosis not present

## 2022-10-09 DIAGNOSIS — R7309 Other abnormal glucose: Secondary | ICD-10-CM | POA: Diagnosis not present

## 2022-10-12 ENCOUNTER — Ambulatory Visit (INDEPENDENT_AMBULATORY_CARE_PROVIDER_SITE_OTHER): Payer: Medicare PPO | Admitting: Psychology

## 2022-10-12 DIAGNOSIS — F4323 Adjustment disorder with mixed anxiety and depressed mood: Secondary | ICD-10-CM

## 2022-10-12 NOTE — Progress Notes (Signed)
[facility] Counselor/Therapist Progress Note  Patient ID: Traci Strong, MRN: [id number]   Date: 10/12/22  Time Spent: 9:03 am -  9:54 am :  51 Minutes  Treatment Type: Individual Therapy.  Reported Symptoms: anxiety and depression.   Mental Status Exam: Appearance:  Well Groomed     Behavior: Appropriate  Motor: Normal  Speech/Language:  Clear and Coherent  Affect: Congruent  Mood: anxious  Thought process: normal  Thought content:   WNL  Sensory/Perceptual disturbances:   WNL  Orientation: oriented to person, place, time/date, and situation  Attention: Good  Concentration: Good  Memory: WNL  Fund of knowledge:  Good  Insight:   Good  Judgment:  Good  Impulse Control: Good   Risk Assessment: Danger to Self:  No Self-injurious Behavior: No Danger to Others: No Duty to Warn:no Physical Aggression / Violence:No  Access to Firearms a concern: No  Gang Involvement:No   Subjective:   Tangia Oja Strong participated from home, via video, is aware of limitations of this medium, and consented to treatment. Therapist participated from home office.  [name] reviewed the events of the past week. She noted her back pain improved. She noted worry about her brother's financial situation. She noted weight-gain due to poor eating and a lack of exercise. She noted her efforts to make behavioral changes in this area and noted her interest in sweet food subsiding. We discussed worked on identifying ways to be more mindful and purposeful with daily eating and shopping. She noted her intent to work on volunteering and noted a sense of excitement about this.  Therapist praised Traci Strong for her efforts in this area. She noted continued grief for her partner and noted his health issues, prior to his passing, triggering her own anxiety about similar symptoms. She noted having numerous acquaintances recently being diagnosed with cancer, which has heightened her anxiety. She noted being  overwhelmed by her financial stressors in related to unexpected household expenses, her brother's care, and endorsed rumination. She noted this resulting in headaches and stomach aches.  Therapist provided psychoeducation regarding anxiety and rumination and the effects on mood.  We worked on identifying ways to set boundaries regarding rumination including writing down worries, cleaning, etc. Traci Strong, identifying areas of control and lack of control, and managing negative self-talk.  Therapist modeled this during the session and Traci Strong appeared to be receptive.  She expressed commitment towards employing these tools between sessions and we will follow up to review this along with identify any possible barriers to managing rumination.  Therapist validated and normalized Traci Strong's feelings and experience and provided supportive therapy.  A follow-up was scheduled for continued treatment which Traci Strong benefits from.  Interventions: CBT   Diagnosis:  Adjustment disorder with mixed anxiety and depressed mood  Treatment Plan:  Client Abilities/Strengths Traci Strong is intelligent, forthcoming, and motivated for change.    Support System: Family and friends.    Client Treatment Preferences Outpatient therapy.   Client Statement of Needs Traci Strong would like to verbalizing feeling towards others, communicate more consistently, reduce avoidance, reduce negative self-talk, think more more positively, highlight strengths and positives for self and others, creating for boundaries for self and others.   Treatment Level Weekly  Symptoms  Anxiety: Worry, rumination,  worry about varying things (finances, getting things done, brother's health), difficulty managing worry, irritability, increased bp & hrt rate, claustrophobia, and history of panic (Status: maintained)  Depression: fluctuating sleep (middle insomnia), lethargy, low motivation, infrequent sadness, infrequent difficulty concentrating, irritability,  weight-gain, and guilt  (Status: maintained)  Goals:   Solimar experiences symptoms of anxiety and depression.    Target Date: 01/05/23 Frequency: Weekly  Progress: 0 Modality: individual    Therapist will provide referrals for additional resources as appropriate.  Therapist will provide psycho-education regarding Traci Strong's diagnosis and corresponding treatment approaches and interventions. Licensed Clinical Social Worker, [place], LCSW will support the patient's ability to achieve the goals identified. will employ CBT, BA, Problem-solving, Solution Focused, Mindfulness,  coping skills, & other evidenced-based practices will be used to promote progress towards healthy functioning to help manage decrease symptoms associated with her diagnosis.   Reduce overall level, frequency, and intensity of the feelings of depression, anxiety and interpersonal stress evidenced by decreased overall symptoms from 6 to 7 days/week to 0 to 1 days/week per client report for at least 3 consecutive months. Verbally express understanding of the relationship between feelings of depression, anxiety and their impact on thinking patterns and behaviors. Verbalize an understanding of the role that distorted thinking plays in creating fears, excessive worry, and ruminations.    [name] participated in the creation of the treatment plan)  Traci Ovens, LCSW

## 2022-10-17 DIAGNOSIS — M542 Cervicalgia: Secondary | ICD-10-CM | POA: Diagnosis not present

## 2022-10-17 DIAGNOSIS — M546 Pain in thoracic spine: Secondary | ICD-10-CM | POA: Diagnosis not present

## 2022-10-17 DIAGNOSIS — M9902 Segmental and somatic dysfunction of thoracic region: Secondary | ICD-10-CM | POA: Diagnosis not present

## 2022-10-17 DIAGNOSIS — M9901 Segmental and somatic dysfunction of cervical region: Secondary | ICD-10-CM | POA: Diagnosis not present

## 2022-10-17 DIAGNOSIS — M9903 Segmental and somatic dysfunction of lumbar region: Secondary | ICD-10-CM | POA: Diagnosis not present

## 2022-10-17 DIAGNOSIS — M62830 Muscle spasm of back: Secondary | ICD-10-CM | POA: Diagnosis not present

## 2022-11-05 DIAGNOSIS — M9901 Segmental and somatic dysfunction of cervical region: Secondary | ICD-10-CM | POA: Diagnosis not present

## 2022-11-05 DIAGNOSIS — M546 Pain in thoracic spine: Secondary | ICD-10-CM | POA: Diagnosis not present

## 2022-11-05 DIAGNOSIS — M9903 Segmental and somatic dysfunction of lumbar region: Secondary | ICD-10-CM | POA: Diagnosis not present

## 2022-11-05 DIAGNOSIS — M9902 Segmental and somatic dysfunction of thoracic region: Secondary | ICD-10-CM | POA: Diagnosis not present

## 2022-11-05 DIAGNOSIS — M62830 Muscle spasm of back: Secondary | ICD-10-CM | POA: Diagnosis not present

## 2022-11-05 DIAGNOSIS — M542 Cervicalgia: Secondary | ICD-10-CM | POA: Diagnosis not present

## 2022-11-07 ENCOUNTER — Ambulatory Visit (INDEPENDENT_AMBULATORY_CARE_PROVIDER_SITE_OTHER): Payer: Medicare PPO | Admitting: Psychology

## 2022-11-07 DIAGNOSIS — F4323 Adjustment disorder with mixed anxiety and depressed mood: Secondary | ICD-10-CM | POA: Diagnosis not present

## 2022-11-07 NOTE — Progress Notes (Signed)
[facility] Counselor/Therapist Progress Note  Patient ID: Traci Strong, MRN: [id number]   Date: 11/07/22  Time Spent: 9:03 am -  9:54 am :  51 Minutes  Treatment Type: Individual Therapy.  Reported Symptoms: anxiety and depression.   Mental Status Exam: Appearance:  Well Groomed     Behavior: Appropriate  Motor: Normal  Speech/Language:  Clear and Coherent  Affect: Congruent  Mood: anxious  Thought process: normal  Thought content:   WNL  Sensory/Perceptual disturbances:   WNL  Orientation: oriented to person, place, time/date, and situation  Attention: Good  Concentration: Good  Memory: WNL  Fund of knowledge:  Good  Insight:   Good  Judgment:  Good  Impulse Control: Good   Risk Assessment: Danger to Self:  No Self-injurious Behavior: No Danger to Others: No Duty to Warn:no Physical Aggression / Violence:No  Access to Firearms a concern: No  Gang Involvement:No   Subjective:   Traci Strong participated from home, via video, is aware of limitations of this medium, and consented to treatment. Therapist participated from home office.  Traci Strong reviewed the events of the past week. Traci Strong noted working on being more social and noted going to church more often and recently attended a concert. She noted her back improving with treatment. She noted interpersonal stressors due to a conversation with a friend that was not being supportive of an issue she was dealing with at home. We explored her feelings regarding this interaction and therapist highlighted jumping to conclusions regarding her friend's motivation. She noted similar experiences, in the past, and noted her feelings regarding this. She noted her interest in giving her friend feedback. We worked on identifying the type of support she was seeking and discussed weather this person is able to provide said support. We worked on identifying ways to provide feedback. Therapist modeled communication during the  session. Traci Strong was engaged and motivated during the session. She expressed commitment towards goals of communicating and boundary setting. Therapist validated and normalized Traci Strong's feelings and provided supportive therapy. A follow-up was scheduled for continued treatment.   Diagnosis:  Adjustment disorder with mixed anxiety and depressed mood  Treatment Plan:  Client Abilities/Strengths Traci Strong is intelligent, forthcoming, and motivated for change.    Support System: Family and friends.    Client Treatment Preferences Outpatient therapy.   Client Statement of Needs Traci Strong would like to verbalizing feeling towards others, communicate more consistently, reduce avoidance, reduce negative self-talk, think more more positively, highlight strengths and positives for self and others, creating for boundaries for self and others.   Treatment Level Weekly  Symptoms  Anxiety: Worry, rumination,  worry about varying things (finances, getting things done, brother's health), difficulty managing worry, irritability, increased bp & hrt rate, claustrophobia, and history of panic (Status: maintained)  Depression: fluctuating sleep (middle insomnia), lethargy, low motivation, infrequent sadness, infrequent difficulty concentrating, irritability, weight-gain, and guilt  (Status: maintained)  Goals:   Traci Strong experiences symptoms of anxiety and depression.    Target Date: 01/05/23 Frequency: Weekly  Progress: 0 Modality: individual    Therapist will provide referrals for additional resources as appropriate.  Therapist will provide psycho-education regarding Traci Strong's diagnosis and corresponding treatment approaches and interventions. Licensed Clinical Social Worker, [place], LCSW will support the patient's ability to achieve the goals identified. will employ CBT, BA, Problem-solving, Solution Focused, Mindfulness,  coping skills, & other evidenced-based practices will be used to promote progress  towards healthy functioning to help manage decrease symptoms associated with her  diagnosis.   Reduce overall level, frequency, and intensity of the feelings of depression, anxiety and interpersonal stress evidenced by decreased overall symptoms from 6 to 7 days/week to 0 to 1 days/week per client report for at least 3 consecutive months. Verbally express understanding of the relationship between feelings of depression, anxiety and their impact on thinking patterns and behaviors. Verbalize an understanding of the role that distorted thinking plays in creating fears, excessive worry, and ruminations.    Traci Strong participated in the creation of the treatment plan)  Traci Ovens, LCSW

## 2022-11-13 DIAGNOSIS — Z1211 Encounter for screening for malignant neoplasm of colon: Secondary | ICD-10-CM | POA: Diagnosis not present

## 2022-11-13 DIAGNOSIS — Z1212 Encounter for screening for malignant neoplasm of rectum: Secondary | ICD-10-CM | POA: Diagnosis not present

## 2022-11-21 LAB — COLOGUARD: COLOGUARD: NEGATIVE

## 2022-12-03 DIAGNOSIS — M62830 Muscle spasm of back: Secondary | ICD-10-CM | POA: Diagnosis not present

## 2022-12-03 DIAGNOSIS — M542 Cervicalgia: Secondary | ICD-10-CM | POA: Diagnosis not present

## 2022-12-03 DIAGNOSIS — M546 Pain in thoracic spine: Secondary | ICD-10-CM | POA: Diagnosis not present

## 2022-12-03 DIAGNOSIS — M9903 Segmental and somatic dysfunction of lumbar region: Secondary | ICD-10-CM | POA: Diagnosis not present

## 2022-12-03 DIAGNOSIS — M9901 Segmental and somatic dysfunction of cervical region: Secondary | ICD-10-CM | POA: Diagnosis not present

## 2022-12-03 DIAGNOSIS — M9902 Segmental and somatic dysfunction of thoracic region: Secondary | ICD-10-CM | POA: Diagnosis not present

## 2022-12-05 ENCOUNTER — Ambulatory Visit (INDEPENDENT_AMBULATORY_CARE_PROVIDER_SITE_OTHER): Payer: Medicare PPO | Admitting: Psychology

## 2022-12-05 DIAGNOSIS — F4323 Adjustment disorder with mixed anxiety and depressed mood: Secondary | ICD-10-CM | POA: Diagnosis not present

## 2022-12-05 NOTE — Progress Notes (Signed)
[facility] Counselor/Therapist Progress Note  Patient ID: Traci Strong, MRN: [id number]   Date: 12/05/22  Time Spent: 11:02 am -  11:42 am :  40 Minutes  Treatment Type: Individual Therapy.  Reported Symptoms: anxiety and depression.   Mental Status Exam: Appearance:  Well Groomed     Behavior: Appropriate  Motor: Normal  Speech/Language:  Clear and Coherent  Affect: Congruent  Mood: anxious  Thought process: normal  Thought content:   WNL  Sensory/Perceptual disturbances:   WNL  Orientation: oriented to person, place, time/date, and situation  Attention: Good  Concentration: Good  Memory: WNL  Fund of knowledge:  Good  Insight:   Good  Judgment:  Good  Impulse Control: Good   Risk Assessment: Danger to Self:  No Self-injurious Behavior: No Danger to Others: No Duty to Warn:no Physical Aggression / Violence:No  Access to Firearms a concern: No  Gang Involvement:No   Subjective:   Traci Strong participated from home, via video, is aware of limitations of this medium, and consented to treatment. Therapist participated from home office.  Traci Strong reviewed the events of the past week. Traci Strong noted improvement in her back pain and a negative cologuard test. She discussed her brother's health improving enough, overall, for him to be slated to discontinue hospice service, as a result. She noted having to communicate with her brother some of her concerns and the plan going forward for his care due to a misunderstanding. She noted some worry about her daughter's upcoming delivery and noted having to be separated from her brother for this time due to [place]. She noted making less headway in address household chores and noted a often not staying on track. We explored this during the session and ways to stay focused on each task and being more mindful of the task at hand. She noted her intent to visit more museums, become more active specifically  walking in the neighborhood. Therapist encouraged Traci Strong to work in identifying areas she would like to engage in and to work on creating a routine that she can work on building overtime that would align with her needs and values. [name] was engaged and motivated during the session and expressed commitment toward her goals. Therapist validated and noramlized Traci Strong's feelings and experience and provided supportive therapy.   Diagnosis:  Adjustment disorder with mixed anxiety and depressed mood  Treatment Plan:  Client Abilities/Strengths Traci Strong is intelligent, forthcoming, and motivated for change.    Support System: Family and friends.    Client Treatment Preferences Outpatient therapy.   Client Statement of Needs Traci Strong would like to verbalizing feeling towards others, communicate more consistently, reduce avoidance, reduce negative self-talk, think more more positively, highlight strengths and positives for self and others, creating for boundaries for self and others.   Treatment Level Weekly  Symptoms  Anxiety: Worry, rumination,  worry about varying things (finances, getting things done, brother's health), difficulty managing worry, irritability, increased bp & hrt rate, claustrophobia, and history of panic (Status: maintained)  Depression: fluctuating sleep (middle insomnia), lethargy, low motivation, infrequent sadness, infrequent difficulty concentrating, irritability, weight-gain, and guilt  (Status: maintained)  Goals:   Traci Strong experiences symptoms of anxiety and depression.    Target Date: 01/05/23 Frequency: Weekly  Progress: 0 Modality: individual    Therapist will provide referrals for additional resources as appropriate.  Therapist will provide psycho-education regarding [name]'s diagnosis and corresponding treatment approaches and interventions. Licensed Clinical Social Worker, [place], LCSW will support the  patient's ability to achieve the goals identified. will  employ CBT, BA, Problem-solving, Solution Focused, Mindfulness,  coping skills, & other evidenced-based practices will be used to promote progress towards healthy functioning to help manage decrease symptoms associated with her diagnosis.   Reduce overall level, frequency, and intensity of the feelings of depression, anxiety and interpersonal stress evidenced by decreased overall symptoms from 6 to 7 days/week to 0 to 1 days/week per client report for at least 3 consecutive months. Verbally express understanding of the relationship between feelings of depression, anxiety and their impact on thinking patterns and behaviors. Verbalize an understanding of the role that distorted thinking plays in creating fears, excessive worry, and ruminations.    Traci Strong participated in the creation of the treatment plan)  Traci Ovens, LCSW

## 2022-12-24 DIAGNOSIS — L42 Pityriasis rosea: Secondary | ICD-10-CM | POA: Diagnosis not present

## 2023-01-02 DIAGNOSIS — M9903 Segmental and somatic dysfunction of lumbar region: Secondary | ICD-10-CM | POA: Diagnosis not present

## 2023-01-02 DIAGNOSIS — M9901 Segmental and somatic dysfunction of cervical region: Secondary | ICD-10-CM | POA: Diagnosis not present

## 2023-01-02 DIAGNOSIS — M9902 Segmental and somatic dysfunction of thoracic region: Secondary | ICD-10-CM | POA: Diagnosis not present

## 2023-01-02 DIAGNOSIS — M542 Cervicalgia: Secondary | ICD-10-CM | POA: Diagnosis not present

## 2023-01-02 DIAGNOSIS — M62830 Muscle spasm of back: Secondary | ICD-10-CM | POA: Diagnosis not present

## 2023-01-02 DIAGNOSIS — M546 Pain in thoracic spine: Secondary | ICD-10-CM | POA: Diagnosis not present

## 2023-01-09 ENCOUNTER — Ambulatory Visit (INDEPENDENT_AMBULATORY_CARE_PROVIDER_SITE_OTHER): Payer: Medicare PPO | Admitting: Psychology

## 2023-01-09 DIAGNOSIS — F4323 Adjustment disorder with mixed anxiety and depressed mood: Secondary | ICD-10-CM

## 2023-01-09 NOTE — Progress Notes (Signed)
[facility] Counselor/Therapist Progress Note  Patient ID: Traci Strong, MRN: [id number]   Date: 01/09/23  Time Spent: 10:05 am -  10:58 am : 53 Minutes  Treatment Type: Individual Therapy.  Reported Symptoms: anxiety and depression.   Mental Status Exam: Appearance:  Well Groomed     Behavior: Appropriate  Motor: Normal  Speech/Language:  Clear and Coherent  Affect: Congruent  Mood: anxious  Thought process: normal  Thought content:   WNL  Sensory/Perceptual disturbances:   WNL  Orientation: oriented to person, place, time/date, and situation  Attention: Good  Concentration: Good  Memory: WNL  Fund of knowledge:  Good  Insight:   Good  Judgment:  Good  Impulse Control: Good   Risk Assessment: Danger to Self:  No Self-injurious Behavior: No Danger to Others: No Duty to Warn:no Physical Aggression / Violence:No  Access to Firearms a concern: No  Gang Involvement:No   Subjective:   [name] participated from home, via video, is aware of limitations of this medium, and consented to treatment. Therapist participated from home office.  [name] reviewed the events of the past week. She noted working on being more social than in the past including meeting friends and going to church more often. She provided an update regarding her brothers health, She is working on building a support group for caretakers. She noted stressors regarding her car and needing repairs. She noted some medical issues that she is working on resolving with her providers. She noted stressors with her daughter regarding attempting to stay in communication. She noted her daughter often not responding and unwilling to make time to speak or do video chats for a short duration of time. She noted her interest in having a conversation with her daughter regarding this issue and noted her daughter having difficulty receiving feedback and often becoming antagonistic. We explored this during the session  and [name] reviewed past dynamics with her daughter and noted erratic behavior during teenage years. We worked on reviewing boundaries, identifying additional boundaries, and being assertive. Therapist modeled this during the session. [name] was engaged and motivated during the session and expressed commitment toward her goals. Therapist validated and noramlized [name]'s feelings and experience and provided supportive therapy. A follow was scheduled for continued treatment.   Diagnosis:  Adjustment disorder with mixed anxiety and depressed mood  Treatment Plan:  Client Abilities/Strengths [name] is intelligent, forthcoming, and motivated for change.    Support System: Family and friends.    Client Treatment Preferences Outpatient therapy.   Client Statement of Needs [name] would like to verbalizing feeling towards others, communicate more consistently, reduce avoidance, reduce negative self-talk, think more more positively, highlight strengths and positives for self and others, creating for boundaries for self and others.   Treatment Level Weekly  Symptoms  Anxiety: Worry, rumination,  worry about varying things (finances, getting things done, brother's health), difficulty managing worry, irritability, increased bp & hrt rate, claustrophobia, and history of panic (Status: maintained)  Depression: fluctuating sleep (middle insomnia), lethargy, low motivation, infrequent sadness, infrequent difficulty concentrating, irritability, weight-gain, and guilt  (Status: maintained)  Goals:   [name] experiences symptoms of anxiety and depression.    Target Date: 02/04/23 Frequency: Weekly  Progress: 0 Modality: individual    Therapist will provide referrals for additional resources as appropriate.  Therapist will provide psycho-education regarding [name]'s diagnosis and corresponding treatment approaches and interventions. Licensed Clinical Social Worker, [employer], LCSW will support the  patient's ability to achieve the goals identified.  will employ CBT, BA, Problem-solving, Solution Focused, Mindfulness,  coping skills, & other evidenced-based practices will be used to promote progress towards healthy functioning to help manage decrease symptoms associated with her diagnosis.   Reduce overall level, frequency, and intensity of the feelings of depression, anxiety and interpersonal stress evidenced by decreased overall symptoms from 6 to 7 days/week to 0 to 1 days/week per client report for at least 3 consecutive months. Verbally express understanding of the relationship between feelings of depression, anxiety and their impact on thinking patterns and behaviors. Verbalize an understanding of the role that distorted thinking plays in creating fears, excessive worry, and ruminations.    [name] participated in the creation of the treatment plan)  [name], LCSW

## 2023-01-22 DIAGNOSIS — D485 Neoplasm of uncertain behavior of skin: Secondary | ICD-10-CM | POA: Diagnosis not present

## 2023-01-22 DIAGNOSIS — L570 Actinic keratosis: Secondary | ICD-10-CM | POA: Diagnosis not present

## 2023-01-22 DIAGNOSIS — L538 Other specified erythematous conditions: Secondary | ICD-10-CM | POA: Diagnosis not present

## 2023-01-22 DIAGNOSIS — L298 Other pruritus: Secondary | ICD-10-CM | POA: Diagnosis not present

## 2023-01-22 DIAGNOSIS — L820 Inflamed seborrheic keratosis: Secondary | ICD-10-CM | POA: Diagnosis not present

## 2023-01-22 DIAGNOSIS — R21 Rash and other nonspecific skin eruption: Secondary | ICD-10-CM | POA: Diagnosis not present

## 2023-01-22 DIAGNOSIS — R208 Other disturbances of skin sensation: Secondary | ICD-10-CM | POA: Diagnosis not present

## 2023-01-22 DIAGNOSIS — R58 Hemorrhage, not elsewhere classified: Secondary | ICD-10-CM | POA: Diagnosis not present

## 2023-02-06 ENCOUNTER — Ambulatory Visit (INDEPENDENT_AMBULATORY_CARE_PROVIDER_SITE_OTHER): Payer: Medicare PPO | Admitting: Psychology

## 2023-02-06 DIAGNOSIS — F4323 Adjustment disorder with mixed anxiety and depressed mood: Secondary | ICD-10-CM
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
## 2023-02-07 ENCOUNTER — Other Ambulatory Visit: Payer: Self-pay | Admitting: Nurse Practitioner

## 2023-02-07 DIAGNOSIS — Z0000 Encounter for general adult medical examination without abnormal findings: Secondary | ICD-10-CM

## 2023-02-12 ENCOUNTER — Ambulatory Visit
Admission: RE | Admit: 2023-02-12 | Discharge: 2023-02-12 | Disposition: A | Discharge status: Home / Self Care | Payer: Medicare PPO | Source: Ambulatory Visit | Attending: Nurse Practitioner | Admitting: Nurse Practitioner

## 2023-02-12 DIAGNOSIS — Z0000 Encounter for general adult medical examination without abnormal findings: Secondary | ICD-10-CM

## 2023-02-12 DIAGNOSIS — Z1231 Encounter for screening mammogram for malignant neoplasm of breast: Secondary | ICD-10-CM | POA: Diagnosis not present

## 2023-02-20 DIAGNOSIS — M542 Cervicalgia: Secondary | ICD-10-CM | POA: Diagnosis not present

## 2023-02-20 DIAGNOSIS — M62830 Muscle spasm of back: Secondary | ICD-10-CM | POA: Diagnosis not present

## 2023-02-20 DIAGNOSIS — M9903 Segmental and somatic dysfunction of lumbar region: Secondary | ICD-10-CM | POA: Diagnosis not present

## 2023-02-20 DIAGNOSIS — M546 Pain in thoracic spine: Secondary | ICD-10-CM | POA: Diagnosis not present

## 2023-02-20 DIAGNOSIS — M9901 Segmental and somatic dysfunction of cervical region: Secondary | ICD-10-CM | POA: Diagnosis not present

## 2023-02-20 DIAGNOSIS — M9902 Segmental and somatic dysfunction of thoracic region: Secondary | ICD-10-CM | POA: Diagnosis not present

## 2023-02-21 DIAGNOSIS — L538 Other specified erythematous conditions: Secondary | ICD-10-CM | POA: Diagnosis not present

## 2023-02-21 DIAGNOSIS — L2989 Other pruritus: Secondary | ICD-10-CM | POA: Diagnosis not present

## 2023-02-21 DIAGNOSIS — L439 Lichen planus, unspecified: Secondary | ICD-10-CM | POA: Diagnosis not present

## 2023-02-21 DIAGNOSIS — Z789 Other specified health status: Secondary | ICD-10-CM | POA: Diagnosis not present

## 2023-02-21 DIAGNOSIS — D485 Neoplasm of uncertain behavior of skin: Secondary | ICD-10-CM | POA: Diagnosis not present

## 2023-02-21 DIAGNOSIS — L820 Inflamed seborrheic keratosis: Secondary | ICD-10-CM | POA: Diagnosis not present

## 2023-02-21 DIAGNOSIS — R21 Rash and other nonspecific skin eruption: Secondary | ICD-10-CM | POA: Diagnosis not present

## 2023-02-21 DIAGNOSIS — R208 Other disturbances of skin sensation: Secondary | ICD-10-CM | POA: Diagnosis not present

## 2023-02-21 DIAGNOSIS — L988 Other specified disorders of the skin and subcutaneous tissue: Secondary | ICD-10-CM | POA: Diagnosis not present

## 2023-03-07 ENCOUNTER — Ambulatory Visit: Payer: Medicare PPO | Admitting: Psychology

## 2023-03-07 DIAGNOSIS — F4323 Adjustment disorder with mixed anxiety and depressed mood: Secondary | ICD-10-CM | POA: Diagnosis not present

## 2023-03-07 NOTE — Progress Notes (Signed)
Comprehensive Clinical Assessment (CCA) Note  03/07/2023 Traci Strong [id number]  Time Spent: 9:03  am - 9:50 am: 47 Minutes  Chief Complaint: No chief complaint on file.  Visit Diagnosis: F43.23   Guardian/Payee:  Self    Paperwork requested: No   Reason for Visit /Presenting Problem: depression and anxiety.   Mental Status Exam: Appearance:   Well Groomed     Behavior:  Appropriate  Motor:  Normal  Speech/Language:   Clear and Coherent  Affect:  Congruent  Mood:  normal  Thought process:  normal  Thought content:    WNL  Sensory/Perceptual disturbances:    WNL  Orientation:  oriented to person, place, time/date, and situation  Attention:  Good  Concentration:  Good  Memory:  WNL  Fund of knowledge:   Good  Insight:    Good  Judgment:   Good  Impulse Control:  Good   Reported Symptoms:  Depression and anxiety.   Risk Assessment: Danger to Self:  No Self-injurious Behavior: No Danger to Others: No Duty to Warn:no Physical Aggression / Violence:No  Access to Firearms a concern: No  Gang Involvement:No  Patient / guardian was educated about steps to take if suicide or homicide risk level increases between visits: no While future psychiatric events cannot be accurately predicted, the patient does not currently require acute inpatient psychiatric care and does not currently meet [facility] involuntary commitment criteria.  Substance Abuse History: Current substance abuse: No     Caffeine: ~ 6oz -~16oz of mt dew daily (72 mg caff).  Tobacco: none Alcohol: none Substance use: none.   Past Psychiatric History:   Previous psychological history is significant for anxiety and depression Outpatient Providers:Traci Krichbaum, LCSW History of Psych Hospitalization: No  Psychological Testing:  n/a    Abuse History:  Victim of: No.,  n/a    Report needed: No. Victim of Neglect:No. Perpetrator of  na   Witness / Exposure to Domestic Violence: No   Protective  Services Involvement: No  Witness to [employer] Violence:  No   Family History:  Family History  Problem Relation Age of Onset   Hypertension Mother    Heart attack Mother    Parkinson's disease Mother    Cancer Father        prostate   Hypertension Father    Aneurysm Father    Cancer Brother        thyroid   Hypertension Brother    Diabetes Maternal Grandmother    Heart attack Maternal Grandmother    Breast cancer Paternal Grandmother    Heart attack Paternal Grandmother    Hypertension Daughter     Living situation: the patient lives alone  Sexual Orientation: Straight  Relationship Status: widowed  Name of spouse / other: Traci Strong (deceased) If a parent, number of children / ages: Traci Strong - 105 - Traci Strong (3 weeks)) and Traci Strong (2).   Support Systems: friends  Financial Stress:  Yes   Income/Employment/Disability: Retired  Financial planner: No   Educational History: Education: Risk manager: christian  Any cultural differences that may affect / interfere with treatment:  not applicable   Recreation/Hobbies: Reading, working on house, walking, chat with friends on phone, spend time with friends.   Stressors: Other: Politics, finances, and brother's health.     Strengths: Supportive Relationships, Church, [employer], Spirituality, Hopefulness, and Journalist, newspaper  Barriers:  brother's health, strain with daughter.    Legal History: Pending legal issue / charges: The patient  has no significant history of legal issues. History of legal issue / charges:  na  Medical History/Surgical History: reviewed Past Medical History:  Diagnosis Date   Hypertension    Migraines    Numbness    Osteoporosis 10/2017   T score -2.7    Past Surgical History:  Procedure Laterality Date   TOOTH EXTRACTION     WISDOM TOOTH EXTRACTION      Medications: Current Outpatient Medications  Medication Sig Dispense Refill    ACETAMINOPHEN PO Take by mouth.     amLODipine (NORVASC) 2.5 MG tablet Take 2.5 mg by mouth daily.     Loperamide HCl (IMODIUM PO) Take by mouth.     losartan (COZAAR) 100 MG tablet Take 100 mg by mouth daily.     nadolol (CORGARD) 80 MG tablet Take 120 mg by mouth daily.     risedronate (ACTONEL) 150 MG tablet Take 1 tablet (150 mg total) by mouth every 30 (thirty) days. with water on empty stomach, nothing by mouth or lie down for next 30 minutes. 3 tablet 4   SUMAtriptan (IMITREX) 50 MG tablet Take 50 mg by mouth every 2 (two) hours as needed for migraine. May repeat in 2 hours if headache persists or recurs.     No current facility-administered medications for this visit.    Allergies  Allergen Reactions   Amoxicillin-Pot Clavulanate Rash    Dry heaving   Codeine Other (See Comments)    Increase heart rate   Prednisone Rash    Dry heaving    Diagnoses:  Adjustment disorder with mixed anxiety and depressed mood  Plan of Care: Continued outpatient therapy.   Narrative:   Traci Strong participated from home, via video, and consented to treatment. Therapist participated from  home office. We completed the annual assessment during this session. We reviewed confidentiality prior to the start of the evaluation and Traci Strong expressed understanding and provided consent to proceed. She noted her current stressors including politics, managing finances for herself and brother, and strain in relationship with daughter. She noted being inundated with political ads and listening to the rhetoric "really got do me". She noted frustration regarding the disrespectful ads that she's had to consume. She noted having to manage her own finances and household and helps care for her brother's finances and has been his long-term care-taker. She noted continued strain with her daughter who struggles to manage her mood and communicate positively and respectively. She noted dealing with BP issues but has not  checked this in some time. We discussed working on checking this reliably to ensure that its stabilized. She has made some progress in socializing more and getting more tasks completed at work. She noted employing lists to complete tasks and noted this being generally effective. She noted that her brother continues to be in a long-term placement and that he is doing well, overall. She noted worry about her brother's health due to a complex health issues including a current cancer diagnosis. We completed the GAD-7 and PHQ-9 during the session. Please see details below. She noted having a recent cancer scare but noted the results being benign. Marlisa continues to benefit from counseling. Jisselle is intelligent, self-aware, and motivated for change.         03/07/2023    9:05 AM  GAD 7 : Generalized Anxiety Score  Nervous, Anxious, on Edge 1  Control/stop worrying 1  Worry too much - different things 1  Trouble relaxing 1  Restless 1  Easily annoyed or irritable 0  Afraid - awful might happen 1  Total GAD 7 Score 6  Anxiety Difficulty Not difficult at all       03/07/2023    9:07 AM  Depression screen PHQ 2/9  Decreased Interest 0  Down, Depressed, Hopeless 1  PHQ - 2 Score 1  Altered sleeping 2  Tired, decreased energy 3  Change in appetite 0  Feeling bad or failure about yourself  0  Trouble concentrating 0  Moving slowly or fidgety/restless 0  Suicidal thoughts 0  PHQ-9 Score 6        [name], LCSW

## 2023-03-25 DIAGNOSIS — R21 Rash and other nonspecific skin eruption: Secondary | ICD-10-CM | POA: Diagnosis not present

## 2023-03-25 DIAGNOSIS — R208 Other disturbances of skin sensation: Secondary | ICD-10-CM | POA: Diagnosis not present

## 2023-03-25 DIAGNOSIS — D485 Neoplasm of uncertain behavior of skin: Secondary | ICD-10-CM | POA: Diagnosis not present

## 2023-03-25 DIAGNOSIS — L820 Inflamed seborrheic keratosis: Secondary | ICD-10-CM | POA: Diagnosis not present

## 2023-03-25 DIAGNOSIS — B078 Other viral warts: Secondary | ICD-10-CM | POA: Diagnosis not present

## 2023-03-25 DIAGNOSIS — L2989 Other pruritus: Secondary | ICD-10-CM | POA: Diagnosis not present

## 2023-03-25 DIAGNOSIS — L538 Other specified erythematous conditions: Secondary | ICD-10-CM | POA: Diagnosis not present

## 2023-04-04 DIAGNOSIS — N1831 Chronic kidney disease, stage 3a: Secondary | ICD-10-CM | POA: Diagnosis not present

## 2023-04-04 DIAGNOSIS — G43909 Migraine, unspecified, not intractable, without status migrainosus: Secondary | ICD-10-CM | POA: Diagnosis not present

## 2023-04-04 DIAGNOSIS — I10 Essential (primary) hypertension: Secondary | ICD-10-CM | POA: Diagnosis not present

## 2023-04-05 ENCOUNTER — Ambulatory Visit: Payer: Medicare PPO | Admitting: Psychology

## 2023-04-05 DIAGNOSIS — F4323 Adjustment disorder with mixed anxiety and depressed mood: Secondary | ICD-10-CM | POA: Diagnosis not present

## 2023-04-05 NOTE — Progress Notes (Signed)
[name] Progress Note  Patient ID: Traci Strong, MRN: [id number]   Date: 04/05/23  Time Spent: 2:01  pm - 2:33 pm : 32 Minutes  Treatment Type: Individual Therapy.  Reported Symptoms: anxiety and depression.   Mental Status Exam: Appearance:  Casual     Behavior: Appropriate  Motor: Normal  Speech/Language:  Normal Rate  Affect: Congruent  Mood: normal  Thought process: normal  Thought content:   WNL  Sensory/Perceptual disturbances:   WNL  Orientation: oriented to person, place, time/date, and situation  Attention: Good  Concentration: Good  Memory: WNL  Fund of knowledge:  Good  Insight:   Good  Judgment:  Good  Impulse Control: Good   Risk Assessment: Danger to Self:  No Self-injurious Behavior: No Danger to Others: No Duty to Warn:no Physical Aggression / Violence:No  Access to Firearms a concern: No  Gang Involvement:No   Subjective:   Traci Strong participated from home, via video and consented to treatment. We switch to audio due to technical difficulties. [id number]. Therapist participated from home office.  The patient expressed understanding and agreed to proceed. Traci Strong reviewed the events of the past week. We reviewed numerous treatment approaches including CBT, BA, Problem Solving, and Solution focused therapy. Psych-education regarding the Monta's diagnosis of Adjustment disorder with mixed anxiety and depressed mood was provided during the session. We discussed Traci Strong's goals treatment goals which include exercising consistently and frequently, eating more healthfully, address tasks at home, reconnect with family, manage symptoms, process past events, bolster coping skills, focusing on self. Traci Strong provided verbal approval of the treatment plan.   Interventions: Psycho-education & Goal Setting.   Diagnosis:  Adjustment disorder with mixed anxiety and depressed mood  Psychiatric Treatment: No ,  na   Treatment Plan:  Client Abilities/Strengths Traci Strong is intelligent, self-aware, and motivated for change.   Support System: Family and friends.   Client Treatment Preferences Outpatient therapy.   Client Statement of Needs Traci Strong would like to exercising consistently and frequently, eating more healthfully, address tasks at home, reconnect with family, manage symptoms, process past events, bolster coping skills, focusing on self.   Treatment Level Weekly  Symptoms  Anxiety: feeling anxious, difficulty managing worry, worrying too much, trouble relaxing, restlessness, feeling afraid something awful might happen.    (Status: maintained) Depression: Feeling down, fluctuating sleep, and lethargy.    (Status: maintained)  Goals:   Traci Strong experiences symptoms of depression and anxiety.   Treatment plan signed and available on s-drive:  No, pending signature.    Target Date: 04/04/2024 Frequency: Weekly  Progress: 0 Modality: individual    Therapist will provide referrals for additional resources as appropriate.  Therapist will provide psycho-education regarding Traci Strong's diagnosis and corresponding treatment approaches and interventions. Licensed Clinical Social Worker, [name], LCSW will support the patient's ability to achieve the goals identified. will employ CBT, BA, Problem-solving, Solution Focused, Mindfulness,  coping skills, & other evidenced-based practices will be used to promote progress towards healthy functioning to help manage decrease symptoms associated with her diagnosis.   Reduce overall level, frequency, and intensity of the feelings of depression, anxiety and panic evidenced by decreased overall symptoms from 6 to 7 days/week to 0 to 1 days/week per client report for at least 3 consecutive months. Verbally express understanding of the relationship between feelings of depression, anxiety and their impact on thinking patterns and behaviors. Verbalize an  understanding of the role that distorted thinking plays in creating  fears, excessive worry, and ruminations.    Traci Burton participated in the creation of the treatment plan)    [name], LCSW

## 2023-04-11 DIAGNOSIS — N1831 Chronic kidney disease, stage 3a: Secondary | ICD-10-CM | POA: Diagnosis not present

## 2023-04-11 DIAGNOSIS — I10 Essential (primary) hypertension: Secondary | ICD-10-CM | POA: Diagnosis not present

## 2023-04-11 DIAGNOSIS — R7309 Other abnormal glucose: Secondary | ICD-10-CM | POA: Diagnosis not present

## 2023-04-15 DIAGNOSIS — R21 Rash and other nonspecific skin eruption: Secondary | ICD-10-CM | POA: Diagnosis not present

## 2023-04-15 DIAGNOSIS — L821 Other seborrheic keratosis: Secondary | ICD-10-CM | POA: Diagnosis not present

## 2023-04-15 DIAGNOSIS — L280 Lichen simplex chronicus: Secondary | ICD-10-CM | POA: Diagnosis not present

## 2023-04-15 DIAGNOSIS — L568 Other specified acute skin changes due to ultraviolet radiation: Secondary | ICD-10-CM | POA: Diagnosis not present

## 2023-04-29 DIAGNOSIS — L280 Lichen simplex chronicus: Secondary | ICD-10-CM | POA: Diagnosis not present

## 2023-04-29 DIAGNOSIS — L821 Other seborrheic keratosis: Secondary | ICD-10-CM | POA: Diagnosis not present

## 2023-05-14 DIAGNOSIS — L853 Xerosis cutis: Secondary | ICD-10-CM | POA: Diagnosis not present

## 2023-05-14 DIAGNOSIS — L821 Other seborrheic keratosis: Secondary | ICD-10-CM | POA: Diagnosis not present

## 2023-05-14 DIAGNOSIS — L280 Lichen simplex chronicus: Secondary | ICD-10-CM | POA: Diagnosis not present

## 2023-05-15 ENCOUNTER — Ambulatory Visit: Payer: Medicare PPO | Admitting: Psychology

## 2023-05-15 DIAGNOSIS — F4323 Adjustment disorder with mixed anxiety and depressed mood: Secondary | ICD-10-CM | POA: Diagnosis not present

## 2023-05-15 NOTE — Progress Notes (Signed)
 [facility] Counselor/Therapist Progress Note  Patient ID: Traci Strong, MRN: [id number]   Date: 05/15/23  Time Spent: 1:06 pm - 1:56 pm : 50 Minutes  Treatment Type: Individual Therapy.  Reported Symptoms: anxiety and depression.   Mental Status Exam: Appearance:  Casual     Behavior: Appropriate  Motor: Normal  Speech/Language:  Normal Rate  Affect: Congruent  Mood: normal  Thought process: normal  Thought content:   WNL  Sensory/Perceptual disturbances:   WNL  Orientation: oriented to person, place, time/date, and situation  Attention: Good  Concentration: Good  Memory: WNL  Fund of knowledge:  Good  Insight:   Good  Judgment:  Good  Impulse Control: Good   Risk Assessment: Danger to Self:  No Self-injurious Behavior: No Danger to Others: No Duty to Warn:no Physical Aggression / Violence:No  Access to Firearms a concern: No  Gang Involvement:No   Subjective:   participated from home, via video and consented to treatment.  Therapist participated from home office.  Niani reviewed the events of the past week. She noted some improvement in her relationship with her daughter and is hopeful for continued improvement. She noted having to be more accepting and focusing on the positives. She noted her brother recently being hospitalized and noted this experiencing being negative overall. We explored this during the session. She noted her intent to provide feedback to the hospital. She noted a need to exercise more and noted trepidation to walk outside, during the winter, due to weather and concerns that she would slip. We worked on identify alternatives including a Photographer via silver sneakers if available or a Medical laboratory scientific officer center. She noted a need to focus more on the positive.  She noted a way to do this throughout the year to bble to reflect on this at the end of the year. Therapist encouraged Juniya to work on identifying points of gratitdue daily and the  importance of this in relation to managing mood and perspective. She noted plans to get a dog and work on her diet. Therapist praised Karri for her effort in the session. Therapist validated Megha's feelings and experience and provided supportive therapy. A follow-up was scheduled for continued treatment.    Interventions: CBT  Diagnosis:  Adjustment disorder with mixed anxiety and depressed mood  Psychiatric Treatment: No , na   Treatment Plan:  Client Abilities/Strengths Alysson is intelligent, self-aware, and motivated for change.   Support System: Family and friends.   Client Treatment Preferences Outpatient therapy.   Client Statement of Needs Karletta would like to exercising consistently and frequently, eating more healthfully, address tasks at home, reconnect with family, manage symptoms, process past events, bolster coping skills, focusing on self.   Treatment Level Weekly  Symptoms  Anxiety: feeling anxious, difficulty managing worry, worrying too much, trouble relaxing, restlessness, feeling afraid something awful might happen.    (Status: maintained) Depression: Feeling down, fluctuating sleep, and lethargy.    (Status: maintained)  Goals:   Layann experiences symptoms of depression and anxiety.   Treatment plan signed and available on s-drive:  No, pending signature.    Target Date: 04/04/2024 Frequency: Weekly  Progress: 0 Modality: individual    Therapist will provide referrals for additional resources as appropriate.  Therapist will provide psycho-education regarding Tyshawna's diagnosis and corresponding treatment approaches and interventions. Licensed Clinical Social Worker, [place], LCSW will support the patient's ability to achieve the goals identified. will employ CBT, BA, Problem-solving, Solution Focused, Mindfulness,  coping skills, &  other evidenced-based practices will be used to promote progress towards healthy functioning to help manage decrease  symptoms associated with her diagnosis.   Reduce overall level, frequency, and intensity of the feelings of depression, anxiety and panic evidenced by decreased overall symptoms from 6 to 7 days/week to 0 to 1 days/week per client report for at least 3 consecutive months. Verbally express understanding of the relationship between feelings of depression, anxiety and their impact on thinking patterns and behaviors. Verbalize an understanding of the role that distorted thinking plays in creating fears, excessive worry, and ruminations.    Sherline Distel participated in the creation of the treatment plan)    Belva Boyden, LCSW

## 2023-05-17 ENCOUNTER — Encounter: Payer: Self-pay | Admitting: Allergy & Immunology

## 2023-05-17 ENCOUNTER — Telehealth: Payer: Self-pay

## 2023-05-17 ENCOUNTER — Other Ambulatory Visit: Payer: Self-pay

## 2023-05-17 ENCOUNTER — Ambulatory Visit: Payer: Medicare PPO | Admitting: Allergy & Immunology

## 2023-05-17 VITALS — BP 124/68 | HR 63 | Temp 97.8°F | Ht 60.83 in | Wt 165.2 lb

## 2023-05-17 DIAGNOSIS — R21 Rash and other nonspecific skin eruption: Secondary | ICD-10-CM | POA: Diagnosis not present

## 2023-05-17 DIAGNOSIS — J3489 Other specified disorders of nose and nasal sinuses: Secondary | ICD-10-CM

## 2023-05-17 NOTE — Progress Notes (Unsigned)
NEW PATIENT  Date of Service/Encounter:  05/17/23  Consult requested by: Carylon Perches, MD   Assessment:   Rash  Rhinorrhea - planning for skin testing at the next visit  Plan/Recommendations:   1. Rash - We will get some records from your Dermatologist. - Continue with Aveeno as you are doing.  - continue with clobetasol as needed.  - We may know more after the testing.   2. Rhinorrhea - Because of insurance stipulations, we cannot do skin testing on the same day as your first visit. - We are all working to fight this, but for now we need to do two separate visits.  - We will know more after we do testing at the next visit.  - The skin testing visit can be squeezed in at your convenience.  - Then we can make a more full plan to address all of your symptoms. - Be sure to stop your antihistamines for 3 days before this appointment.   3. Return in about 1 week (around 05/24/2023) for SKIN TESTING (1-55 + 1-13). You can have the follow up appointment with Dr. Dellis Anes or a Nurse Practicioner (our Nurse Practitioners are excellent and always have Physician oversight!).    This note in its entirety was forwarded to the Provider who requested this consultation.  Subjective:   Traci Strong is a 68 y.o. female presenting today for evaluation of  Chief Complaint  Patient presents with   New Patient (Initial Visit)    Rash around the stomach    Traci Strong has a history of the following: Patient Active Problem List   Diagnosis Date Noted   Idiopathic peripheral neuropathy 10/19/2019   Paresthesia 10/15/2019    History obtained from: chart review and patient.  Discussed the use of AI scribe software for clinical note transcription with the patient and/or guardian, who gave verbal consent to proceed.  Traci Strong was referred by Carylon Perches, MD.     Traci Strong is a 68 y.o. female presenting for an evaluation of rhinorrhea .  [name] presents with a skin condition that  began at the end of the previous year. Initially, she noticed a rash on her chest, which was accompanied by night sweats. The rash subsequently spread to her back. She sought medical attention and was prescribed a medication that partially alleviated the rash on her chest, but she was unable to apply it effectively to her back.  In addition to the rash, the patient also noticed the appearance of small bumps on her skin, which she found alarming. She sought further help from a dermatologist due to concerns about a couple of moles. The dermatologist changed her medication and performed several biopsies. One of the biopsies revealed benign cancer, but a subsequent biopsy in the same area came back negative. The dermatologist also performed a punch biopsy.  She was prescribed clobetasol, which she found effective in clearing up her skin condition. However, she remains concerned about the underlying cause of her skin issues, particularly given her family history of severe skin problems.  She also reports severe itching associated with her skin condition, which was so intense that it would wake her up at night. She found relief by applying an ice pack to the affected areas. The rash and itching started on her chest, then spread to her back, arms, and legs.  She denies any changes in her diet or any correlation between her symptoms and food intake. She also denies any fever, autoimmune  symptoms, or arthritis. She reports a lot of stress in her life, primarily due to caring for her brother who has metastatic thyroid cancer.  The patient is retired and enjoys reading, socializing with friends, shopping, and traveling. She reports no history of asthma or smoking. She grew up in [place], [place], and does not recall having significant allergy symptoms as a child. She does report frequent sinus issues, particularly in the winter, but has not been tested for environmental allergies. She uses Aveeno for  moisturizing her skin.     She has been on clobetasol, hydrocortisone, fluocinonide, tacrolimus and triamcinolone. She had one particular cream that made her skin burn.   We did receive the outside records from the dermatology practice.  She was diagnosed with lichenoid dermatitis and xerosis.  She had a biopsy performed of in December 2024 that showed patchy lichenoid lymphocytic infiltrate.  There were a few necrotic keratinocytes.  Differential diagnosis includes lichen planus versus lichenoid eruptions secondary to drugs.  A PAS stain was negative for fungal organisms.  Results will be scanned into the system.  She does have a history of hypertension and is on amlodipine, losartan, and nadolol for this.  She also has migraines which are treated with sumatriptan.  She has osteoporosis treated with a bisphosphonate once a month.  Otherwise, there is no history of other atopic diseases, including drug allergies, stinging insect allergies, or contact dermatitis. There is no significant infectious history. Vaccinations are up to date.    Past Medical History: Patient Active Problem List   Diagnosis Date Noted   Idiopathic peripheral neuropathy 10/19/2019   Paresthesia 10/15/2019    Medication List:  Allergies as of 05/17/2023       Reactions   Amoxicillin-pot Clavulanate Rash   Dry heaving   Codeine Other (See Comments)   Increase heart rate   Prednisone Rash   Dry heaving        Medication List        Accurate as of May 17, 2023 11:59 PM. If you have any questions, ask your nurse or doctor.          ACETAMINOPHEN PO Take by mouth.   amLODipine 2.5 MG tablet Commonly known as: NORVASC Take 2.5 mg by mouth daily.   IMODIUM PO Take by mouth.   losartan 100 MG tablet Commonly known as: COZAAR Take 100 mg by mouth daily.   nadolol 80 MG tablet Commonly known as: CORGARD Take 120 mg by mouth daily.   risedronate 150 MG tablet Commonly known as: ACTONEL Take  1 tablet (150 mg total) by mouth every 30 (thirty) days. with water on empty stomach, nothing by mouth or lie down for next 30 minutes.   SUMAtriptan 50 MG tablet Commonly known as: IMITREX Take 50 mg by mouth every 2 (two) hours as needed for migraine. May repeat in 2 hours if headache persists or recurs.        Birth History: non-contributory  Developmental History: non-contributory  Past Surgical History: Past Surgical History:  Procedure Laterality Date   TOOTH EXTRACTION     WISDOM TOOTH EXTRACTION       Family History: Family History  Problem Relation Age of Onset   Hypertension Mother    Heart attack Mother    Parkinson's disease Mother    Cancer Father        prostate   Hypertension Father    Aneurysm Father    Cancer Brother  thyroid   Hypertension Brother    Diabetes Maternal Grandmother    Heart attack Maternal Grandmother    Breast cancer Paternal Grandmother    Heart attack Paternal Grandmother    Hypertension Daughter      Social History: Nariya lives at home with her family.  She lives in a house that is 70 years old.  There is electric vinyl plank throughout the home.  They have a heat pump for heating and cooling.  There is a cat inside of the home.  There are no dust mite covers on the bedding.  There is no tobacco exposure.  She is currently retired.  There is no fume, chemical, or dust exposure.  They do not use a HEPA filter.  They do not live near an interstate or industrial area.   Review of systems otherwise negative other than that mentioned in the HPI.    Objective:   Blood pressure 124/68, pulse 63, temperature 97.8 F (36.6 C), temperature source Temporal, height 5' 0.83" (1.545 m), weight 165 lb 3.2 oz (74.9 kg), SpO2 94%. Body mass index is 31.39 kg/m.     Physical Exam Vitals reviewed.  Constitutional:      Appearance: She is well-developed.     Comments: Very pleasant.  Cooperative with the exam.  HENT:     Head:  Normocephalic and atraumatic.     Right Ear: Tympanic membrane, ear canal and external ear normal. No drainage, swelling or tenderness. Tympanic membrane is not injected, scarred, erythematous, retracted or bulging.     Left Ear: Tympanic membrane, ear canal and external ear normal. No drainage, swelling or tenderness. Tympanic membrane is not injected, scarred, erythematous, retracted or bulging.     Nose: Rhinorrhea present. No nasal deformity, septal deviation or mucosal edema.     Right Turbinates: Enlarged and swollen.     Left Turbinates: Enlarged and swollen.     Right Sinus: No maxillary sinus tenderness or frontal sinus tenderness.     Left Sinus: No maxillary sinus tenderness or frontal sinus tenderness.     Mouth/Throat:     Mouth: Mucous membranes are not pale and not dry.     Pharynx: Uvula midline.  Eyes:     General:        Right eye: No discharge.        Left eye: No discharge.     Conjunctiva/sclera: Conjunctivae normal.     Right eye: Right conjunctiva is not injected. No chemosis.    Left eye: Left conjunctiva is not injected. No chemosis.    Pupils: Pupils are equal, round, and reactive to light.  Cardiovascular:     Rate and Rhythm: Normal rate and regular rhythm.     Heart sounds: Normal heart sounds.  Pulmonary:     Effort: Pulmonary effort is normal. No tachypnea, accessory muscle usage or respiratory distress.     Breath sounds: Normal breath sounds. No wheezing, rhonchi or rales.  Chest:     Chest wall: No tenderness.  Abdominal:     Tenderness: There is no abdominal tenderness. There is no guarding or rebound.  Lymphadenopathy:     Head:     Right side of head: No submandibular, tonsillar or occipital adenopathy.     Left side of head: No submandibular, tonsillar or occipital adenopathy.     Cervical: No cervical adenopathy.  Skin:    Coloration: Skin is not pale.     Findings: No abrasion, erythema, petechiae or rash. Rash  is not papular, urticarial or  vesicular.  Neurological:     Mental Status: She is alert.  Psychiatric:        Behavior: Behavior is cooperative.      Diagnostic studies: deferred due to insurance stipulations that require a separate visit for testing          Malachi Bonds, MD [facility]

## 2023-05-17 NOTE — Telephone Encounter (Signed)
Received fax from [facility] with records. Records have been given to Dr.Joel Gallagher,MD for review.

## 2023-05-17 NOTE — Patient Instructions (Addendum)
1. Rash - We will get some records from your Dermatologist. - Continue with Aveeno as you are doing.  - continue with clobetasol as needed.  - We may know more after the testing.   2. Rhinorrhea - Because of insurance stipulations, we cannot do skin testing on the same day as your first visit. - We are all working to fight this, but for now we need to do two separate visits.  - We will know more after we do testing at the next visit.  - The skin testing visit can be squeezed in at your convenience.  - Then we can make a more full plan to address all of your symptoms. - Be sure to stop your antihistamines for 3 days before this appointment.   3. Return in about 1 week (around 05/24/2023) for SKIN TESTING (1-55 + 1-13). You can have the follow up appointment with Dr. Dellis Anes or a Nurse Practicioner (our Nurse Practitioners are excellent and always have Physician oversight!).    Please inform us of any Emergency Department visits, hospitalizations, or changes in symptoms. Call us before going to the ED for breathing or allergy symptoms since we might be able to fit you in for a sick visit. Feel free to contact us anytime with any questions, problems, or concerns.  It was a pleasure to meet you today!  Websites that have reliable patient information: 1. American Academy of Asthma, Allergy, and Immunology: www.aaaai.org 2. Food Allergy Research and Education (FARE): foodallergy.org 3. Mothers of Asthmatics: http://www.asthmacommunitynetwork.org 4. American College of Allergy, Asthma, and Immunology: www.acaai.org      "Like" [place] on Facebook and Instagram for our latest updates!      A healthy democracy works best when [employer] participate! Make sure you are registered to vote! If you have moved or changed any of your contact information, you will need to get this updated before voting! Scan the QR codes below to learn more!

## 2023-05-20 ENCOUNTER — Encounter: Payer: Self-pay | Admitting: Allergy & Immunology

## 2023-05-29 ENCOUNTER — Encounter: Payer: Self-pay | Admitting: Allergy & Immunology

## 2023-05-29 ENCOUNTER — Ambulatory Visit: Payer: Medicare PPO | Admitting: Allergy & Immunology

## 2023-05-29 DIAGNOSIS — J302 Other seasonal allergic rhinitis: Secondary | ICD-10-CM | POA: Diagnosis not present

## 2023-05-29 DIAGNOSIS — J3089 Other allergic rhinitis: Secondary | ICD-10-CM | POA: Diagnosis not present

## 2023-05-29 DIAGNOSIS — R21 Rash and other nonspecific skin eruption: Secondary | ICD-10-CM

## 2023-05-29 MED ORDER — IPRATROPIUM BROMIDE 0.03 % NA SOLN: 2.0000 | Freq: Three times a day (TID) | NASAL | 5 refills | Status: AC | PRN

## 2023-05-29 MED ORDER — CETIRIZINE HCL 10 MG PO TABS: 10.0000 mg | ORAL_TABLET | Freq: Every day | ORAL | 1 refills | Status: AC

## 2023-05-29 NOTE — Progress Notes (Signed)
FOLLOW UP  Date of Service/Encounter:  05/29/23   Assessment:   Rash   Perennial and seasonal allergic rhinitis (grasses, weeds, trees, indoor molds, outdoor molds, dust mites, and cat)  Plan/Recommendations:   1. Rash - I agree with trying to change around the blood pressure medications. - Continue with Aveeno as you are doing.  - Continue with clobetasol as needed.  - We can consider allergy shots which are curative or even consider something like Nemluvio which can help with the itching/rash. - Information on Nemuvio provided.   2. Rhinorrhea - Testing today showed: grasses, weeds, trees, indoor molds, outdoor molds, dust mites, and cat - Copy of test results provided.  - Avoidance measures provided. - Get a HEPA filter for your bedroom and keep the cat out to make this a cat-free zone. - Agree with the plan to get rid of the rest of the carpeting. - Start taking: Zyrtec (cetirizine) 10mg  tablet once daily and Atrovent (ipratropium) 0.03% one spray per nostril 2-3 times daily as needed (CAN BE OVER DRYING) - You can use an extra dose of the antihistamine, if needed, for breakthrough symptoms.  - Consider nasal saline rinses 1-2 times daily to remove allergens from the nasal cavities as well as help with mucous clearance (this is especially helpful to do before the nasal sprays are given) - Consider allergy shots as a means of long-term control. - Allergy shots "re-train" and "reset" the immune system to ignore environmental allergens and decrease the resulting immune response to those allergens (sneezing, itchy watery eyes, runny nose, nasal congestion, etc).    - Allergy shots improve symptoms in 75-85% of patients.  - We can discuss more at the next appointment if the medications are not working for you.  3. Return in about 3 months (around 08/27/2023). You can have the follow up appointment with Dr. Dellis Anes or a Nurse Practicioner (our Nurse Practitioners are excellent  and always have Physician oversight!).     Subjective:   Traci Strong is a 68 y.o. female presenting today for follow up of No chief complaint on file.   Traci Strong has a history of the following: Patient Active Problem List   Diagnosis Date Noted   Idiopathic peripheral neuropathy 10/19/2019   Paresthesia 10/15/2019    History obtained from: chart review and patient.  Discussed the use of AI scribe software for clinical note transcription with the patient and/or guardian, who gave verbal consent to proceed.  Traci Strong is a 68 y.o. female presenting for skin testing. She was last seen on January 17. We could not do testing because her insurance company does not cover testing on the same day as a New Patient visit. She has been off of all antihistamines 3 days in anticipation of the testing.   At that visit, we obtained records from her dermatologist.  We continue with Aveeno and clobetasol.  For her rhinorrhea, we decided to do allergy testing which is why she presents today.    Her outside records showed lichenoid dermatitis and xerosis with a biopsy performed in December 2024 that showed patchy lichenoid lymphocytic infiltrates with few necrotic keratinocytes.  A differential diagnosis included lichen planus versus lichenoid eruptions secondary to drugs.  Staining was negative for fungal organisms.  Otherwise, there have been no changes to her past medical history, surgical history, family history, or social history.    Review of systems otherwise negative other than that mentioned in the HPI.    Objective:  There were no vitals taken for this visit. There is no height or weight on file to calculate BMI.    Physical exam deferred since this was a skin testing appointment only.   Diagnostic studies:   Allergy Studies:     Airborne Adult Perc - 05/29/23 0937     Time Antigen Placed 6962    Allergen Manufacturer Waynette Buttery    Location Back    Number of Test 55    1.  Control-Buffer 50% Glycerol Negative    2. Control-Histamine 3+    3. Bahia Negative    4. French Southern Territ[place] Negative    5. Johnson Negative    6. Kentucky Blue Negative    7. Meadow Fescue Negative    8. Perennial Rye 3+    9. Timothy 2+    10. Ragweed Mix Negative    11. Cocklebur Negative    12. Plantain,  English Negative    13. Baccharis Negative    14. Dog Fennel Negative    15. Russian Thistle Negative    16. Lamb's Quarters Negative    17. Sheep Sorrell Negative    18. Rough Pigweed Negative    19. Marsh Elder, Rough 2+    20. Mugwort, Common 2+    21. Box, Elder Negative    22. Cedar, red Negative    23. Sweet Gum Negative    24. Pecan Pollen Negative    25. Pine Mix Negative    26. Walnut, Black Pollen Negative    27. Red Mulberry Negative    28. Ash Mix Negative    29. Birch Mix Negative    30. Beech American 2+    31. Cottonwood, Guinea-Bissau Negative    32. Hickory, White Negative    33. Maple Mix Negative    34. Oak, [place] Mix Negative    35. Sycamore Eastern Negative    36. Alternaria Alternata Negative    37. Cladosporium Herbarum Negative    38. Aspergillus Mix Negative    39. Penicillium Mix Negative    40. Bipolaris Sorokiniana (Helminthosporium) Negative    41. Drechslera Spicifera (Curvularia) Negative    42. Mucor Plumbeus Negative    43. Fusarium Moniliforme Negative    44. Aureobasidium Pullulans (pullulara) Negative    45. Rhizopus Oryzae Negative    46. Botrytis Cinera Negative    47. Epicoccum Nigrum Negative    48. Phoma Betae Negative    49. Dust Mite Mix 2+    50. Cat Hair 10,000 BAU/ml 3+    51.  Dog Epithelia Negative    52. Mixed Feathers Negative    53. Horse Epithelia Negative    54. Cockroach, German Negative    55. Tobacco Leaf Negative             13 Food Perc - 05/29/23 0937       Test Information   Time Antigen Placed 9528    Allergen Manufacturer Waynette Buttery    Location Back    Number of allergen test 13      Food   1.  Peanut Negative    2. Soybean Negative    3. Wheat Negative    4. Sesame Negative    5. Milk, Cow Negative    6. Casein Negative    7. Egg White, Chicken Negative    8. Shellfish Mix Negative    9. Fish Mix Negative    10. Cashew Negative    11. [facility] Food Negative  12. Almond Negative    13. Hazelnut Negative             Intradermal - 05/29/23 0953     Time Antigen Placed 1000    Allergen Manufacturer Waynette Buttery    Location Arm    Number of Test 10    Control Negative    Bahia Negative    French Southern Territ[place] Negative    Johnson Negative    Mold 1 3+    Mold 2 2+    Mold 3 Negative    Mold 4 2+    Dog Negative    Cockroach Negative             Allergy testing results were read and interpreted by myself, documented by clinical staff.      Malachi Bonds, MD  [facility]

## 2023-05-29 NOTE — Patient Instructions (Addendum)
1. Rash - I agree with trying to change around the blood pressure medications. - Continue with Aveeno as you are doing.  - Continue with clobetasol as needed.  - We can consider allergy shots which are curative or even consider something like Nemluvio which can help with the itching/rash. - Information on Nemuvio provided.   2. Rhinorrhea - Testing today showed: grasses, weeds, trees, indoor molds, outdoor molds, dust mites, and cat - Copy of test results provided.  - Avoidance measures provided. - Get a HEPA filter for your bedroom and keep the cat out to make this a cat-free zone. - Agree with the plan to get rid of the rest of the carpeting. - Start taking: Zyrtec (cetirizine) 10mg  tablet once daily and Atrovent (ipratropium) 0.03% one spray per nostril 2-3 times daily as needed (CAN BE OVER DRYING) - You can use an extra dose of the antihistamine, if needed, for breakthrough symptoms.  - Consider nasal saline rinses 1-2 times daily to remove allergens from the nasal cavities as well as help with mucous clearance (this is especially helpful to do before the nasal sprays are given) - Consider allergy shots as a means of long-term control. - Allergy shots "re-train" and "reset" the immune system to ignore environmental allergens and decrease the resulting immune response to those allergens (sneezing, itchy watery eyes, runny nose, nasal congestion, etc).    - Allergy shots improve symptoms in 75-85% of patients.  - We can discuss more at the next appointment if the medications are not working for you.  3. Return in about 3 months (around 08/27/2023). You can have the follow up appointment with Dr. Dellis Anes or a Nurse Practicioner (our Nurse Practitioners are excellent and always have Physician oversight!).    Please inform us of any Emergency Department visits, hospitalizations, or changes in symptoms. Call us before going to the ED for breathing or allergy symptoms since we might be able to  fit you in for a sick visit. Feel free to contact us anytime with any questions, problems, or concerns.  It was a pleasure to meet you today!  Websites that have reliable patient information: 1. American Academy of Asthma, Allergy, and Immunology: www.aaaai.org 2. Food Allergy Research and Education (FARE): foodallergy.org 3. Mothers of Asthmatics: http://www.asthmacommunitynetwork.org 4. American College of Allergy, Asthma, and Immunology: www.acaai.org      "Like" [place] on Facebook and Instagram for our latest updates!      A healthy democracy works best when [employer] participate! Make sure you are registered to vote! If you have moved or changed any of your contact information, you will need to get this updated before voting! Scan the QR codes below to learn more!       Airborne Adult Perc - 05/29/23 0937     Time Antigen Placed 6578    Allergen Manufacturer Waynette Buttery    Location Back    Number of Test 55    1. Control-Buffer 50% Glycerol Negative    2. Control-Histamine 3+    3. Bahia Negative    4. French Southern Territories Negative    5. Johnson Negative    6. Kentucky Blue Negative    7. Meadow Fescue Negative    8. Perennial Rye 3+    9. Timothy 2+    10. Ragweed Mix Negative    11. Cocklebur Negative    12. Plantain,  English Negative    13. Baccharis Negative    14. Dog Fennel Negative    15. [place]  Thistle Negative    16. Lamb's Quarters Negative    17. Sheep Sorrell Negative    18. Rough Pigweed Negative    19. Marsh Elder, Rough 2+    20. Mugwort, Common 2+    21. Box, Elder Negative    22. Cedar, red Negative    23. Sweet Gum Negative    24. Pecan Pollen Negative    25. Pine Mix Negative    26. Walnut, Black Pollen Negative    27. Red Mulberry Negative    28. Ash Mix Negative    29. Birch Mix Negative    30. Beech American 2+    31. Cottonwood, Guinea-Bissau Negative    32. Hickory, White Negative    33. Maple Mix Negative    34. Oak, [place] Mix Negative    35.  Sycamore Eastern Negative    36. Alternaria Alternata Negative    37. Cladosporium Herbarum Negative    38. Aspergillus Mix Negative    39. Penicillium Mix Negative    40. Bipolaris Sorokiniana (Helminthosporium) Negative    41. Drechslera Spicifera (Curvularia) Negative    42. Mucor Plumbeus Negative    43. Fusarium Moniliforme Negative    44. Aureobasidium Pullulans (pullulara) Negative    45. Rhizopus Oryzae Negative    46. Botrytis Cinera Negative    47. Epicoccum Nigrum Negative    48. Phoma Betae Negative    49. Dust Mite Mix 2+    50. Cat Hair 10,000 BAU/ml 3+    51.  Dog Epithelia Negative    52. Mixed Feathers Negative    53. Horse Epithelia Negative    54. Cockroach, German Negative    55. Tobacco Leaf Negative             13 Food Perc - 05/29/23 0937       Test Information   Time Antigen Placed 7829    Allergen Manufacturer Waynette Buttery    Location Back    Number of allergen test 13      Food   1. Peanut Negative    2. Soybean Negative    3. Wheat Negative    4. Sesame Negative    5. Milk, Cow Negative    6. Casein Negative    7. Egg White, Chicken Negative    8. Shellfish Mix Negative    9. Fish Mix Negative    10. Cashew Negative    11. Walnut Food Negative    12. Almond Negative    13. Hazelnut Negative             Intradermal - 05/29/23 0953     Time Antigen Placed 1000    Allergen Manufacturer Waynette Buttery    Location Arm    Number of Test 10    Control Negative    Bahia Negative    French Southern Territories Negative    Johnson Negative    Mold 1 3+    Mold 2 2+    Mold 3 Negative    Mold 4 2+    Dog Negative    Cockroach Negative             Reducing Pollen Exposure  The American Academy of Allergy, Asthma and Immunology suggests the following steps to reduce your exposure to pollen during allergy seasons.    Do not hang sheets or clothing out to dry; pollen may collect on these items. Do not mow lawns or spend time around freshly cut grass; mowing  stirs up pollen.  Keep windows closed at night.  Keep car windows closed while driving. Minimize morning activities outdoors, a time when pollen counts are usually at their highest. Stay indoors as much as possible when pollen counts or humidity is high and on windy days when pollen tends to remain in the air longer. Use air conditioning when possible.  Many air conditioners have filters that trap the pollen spores. Use a HEPA room air filter to remove pollen form the indoor air you breathe.  Control of Mold Allergen   Mold and fungi can grow on a variety of surfaces provided certain temperature and moisture conditions exist.  Outdoor molds grow on plants, decaying vegetation and soil.  The major outdoor mold, Alternaria and Cladosporium, are found in very high numbers during hot and dry conditions.  Generally, a late Summer - Fall peak is seen for common outdoor fungal spores.  Rain will temporarily lower outdoor mold spore count, but counts rise rapidly when the rainy period ends.  The most important indoor molds are Aspergillus and Penicillium.  Dark, humid and poorly ventilated basements are ideal sites for mold growth.  The next most common sites of mold growth are the bathroom and the kitchen.  Outdoor (Seasonal) Mold Control  Positive outdoor molds via skin testing: Alternaria and Cladosporium  Use air conditioning and keep windows closed Avoid exposure to decaying vegetation. Avoid leaf raking. Avoid grain handling. Consider wearing a face mask if working in moldy areas.    Indoor (Perennial) Mold Control   Positive indoor molds via skin testing: Aspergillus, Penicillium, Fusarium, Aureobasidium (Pullulara), and Rhizopus  Maintain humidity below 50%. Clean washable surfaces with 5% bleach solution. Remove sources e.g. contaminated carpets.    Control of Dust Mite Allergen    Dust mites play a major role in allergic asthma and rhinitis.  They occur in environments with high  humidity wherever human skin is found.  Dust mites absorb humidity from the atmosphere (ie, they do not drink) and feed on organic matter (including shed human and animal skin).  Dust mites are a microscopic type of insect that you cannot see with the naked eye.  High levels of dust mites have been detected from mattresses, pillows, carpets, upholstered furniture, bed covers, clothes, soft toys and any woven material.  The principal allergen of the dust mite is found in its feces.  A gram of dust may contain 1,000 mites and 250,000 fecal particles.  Mite antigen is easily measured in the air during house cleaning activities.  Dust mites do not bite and do not cause harm to humans, other than by triggering allergies/asthma.    Ways to decrease your exposure to dust mites in your home:  Encase mattresses, box springs and pillows with a mite-impermeable barrier or cover   Wash sheets, blankets and drapes weekly in hot water (130 F) with detergent and dry them in a dryer on the hot setting.  Have the room cleaned frequently with a vacuum cleaner and a damp dust-mop.  For carpeting or rugs, vacuuming with a vacuum cleaner equipped with a high-efficiency particulate air (HEPA) filter.  The dust mite allergic individual should not be in a room which is being cleaned and should wait 1 hour after cleaning before going into the room. Do not sleep on upholstered furniture (eg, couches).   If possible removing carpeting, upholstered furniture and drapery from the home is ideal.  Horizontal blinds should be eliminated in the rooms where the person spends the most time (bedroom,  study, television room).  Washable vinyl, roller-type shades are optimal. Remove all non-washable stuffed toys from the bedroom.  Wash stuffed toys weekly like sheets and blankets above.   Reduce indoor humidity to less than 50%.  Inexpensive humidity monitors can be purchased at most hardware stores.  Do not use a humidifier as can make the  problem worse and are not recommended.  Control of Dog or Cat Allergen  Avoidance is the best way to manage a dog or cat allergy. If you have a dog or cat and are allergic to dog or cats, consider removing the dog or cat from the home. If you have a dog or cat but don't want to find it a new home, or if your family wants a pet even though someone in the household is allergic, here are some strategies that may help keep symptoms at bay:  Keep the pet out of your bedroom and restrict it to only a few rooms. Be advised that keeping the dog or cat in only one room will not limit the allergens to that room. Don't pet, hug or kiss the dog or cat; if you do, wash your hands with soap and water. High-efficiency particulate air (HEPA) cleaners run continuously in a bedroom or living room can reduce allergen levels over time. Regular use of a high-efficiency vacuum cleaner or a central vacuum can reduce allergen levels. Giving your dog or cat a bath at least once a week can reduce airborne allergen.  .[email or web address]

## 2023-05-31 ENCOUNTER — Encounter: Payer: Self-pay | Admitting: Allergy & Immunology

## 2023-06-03 IMAGING — DX DG FEMUR 2+V*R*
4 series · 4 of 4 positions shown · non-contrast
Comparison: None.

CLINICAL DATA: Pain

EXAM:
RIGHT FEMUR 2 VIEWS

[femur ap (1 of 2)]
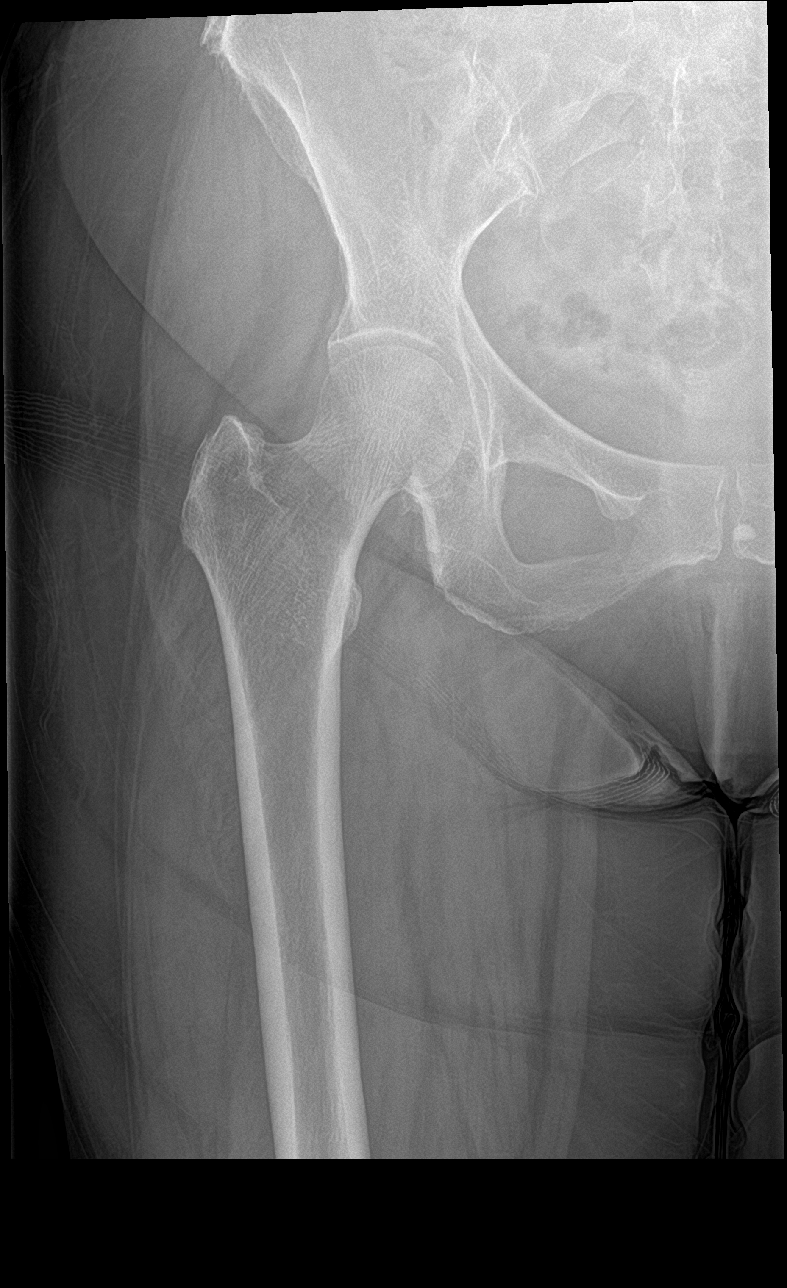

[femur ap (2 of 2)]
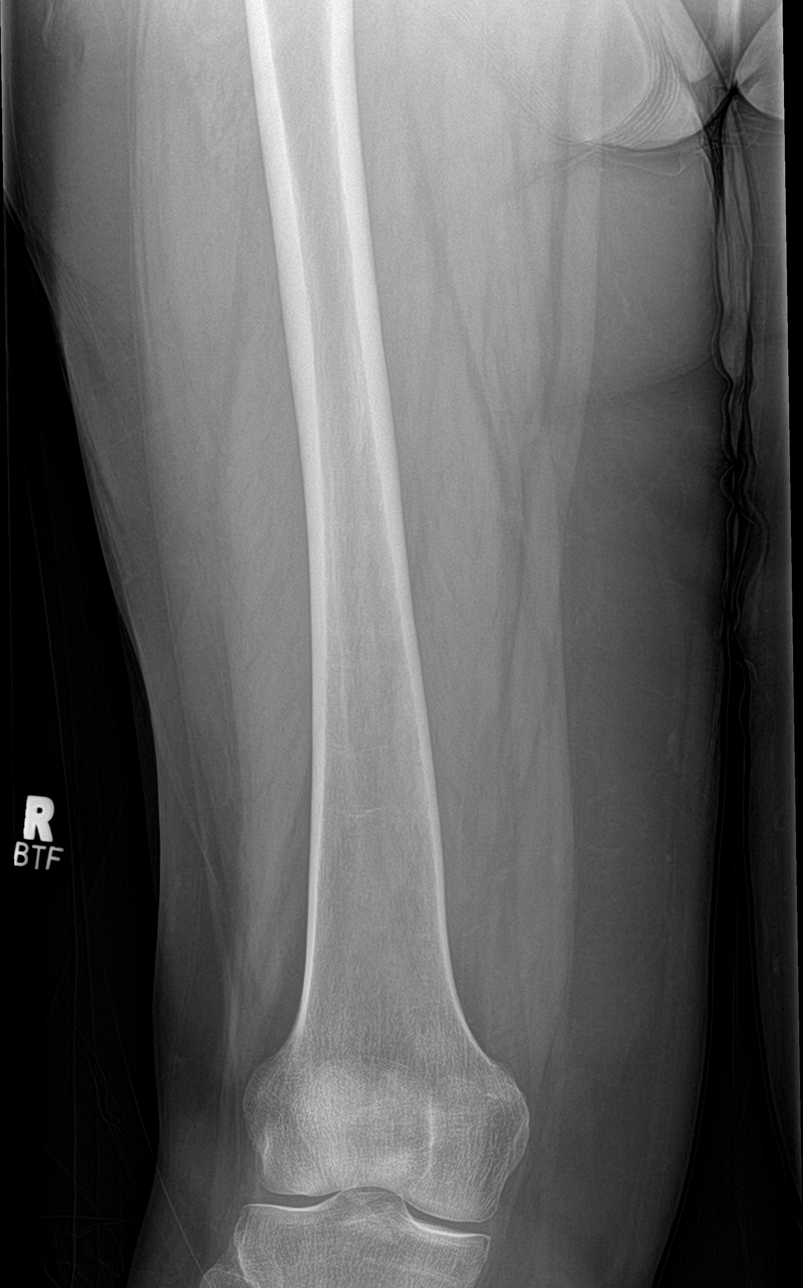

[femur lat (1 of 2)]
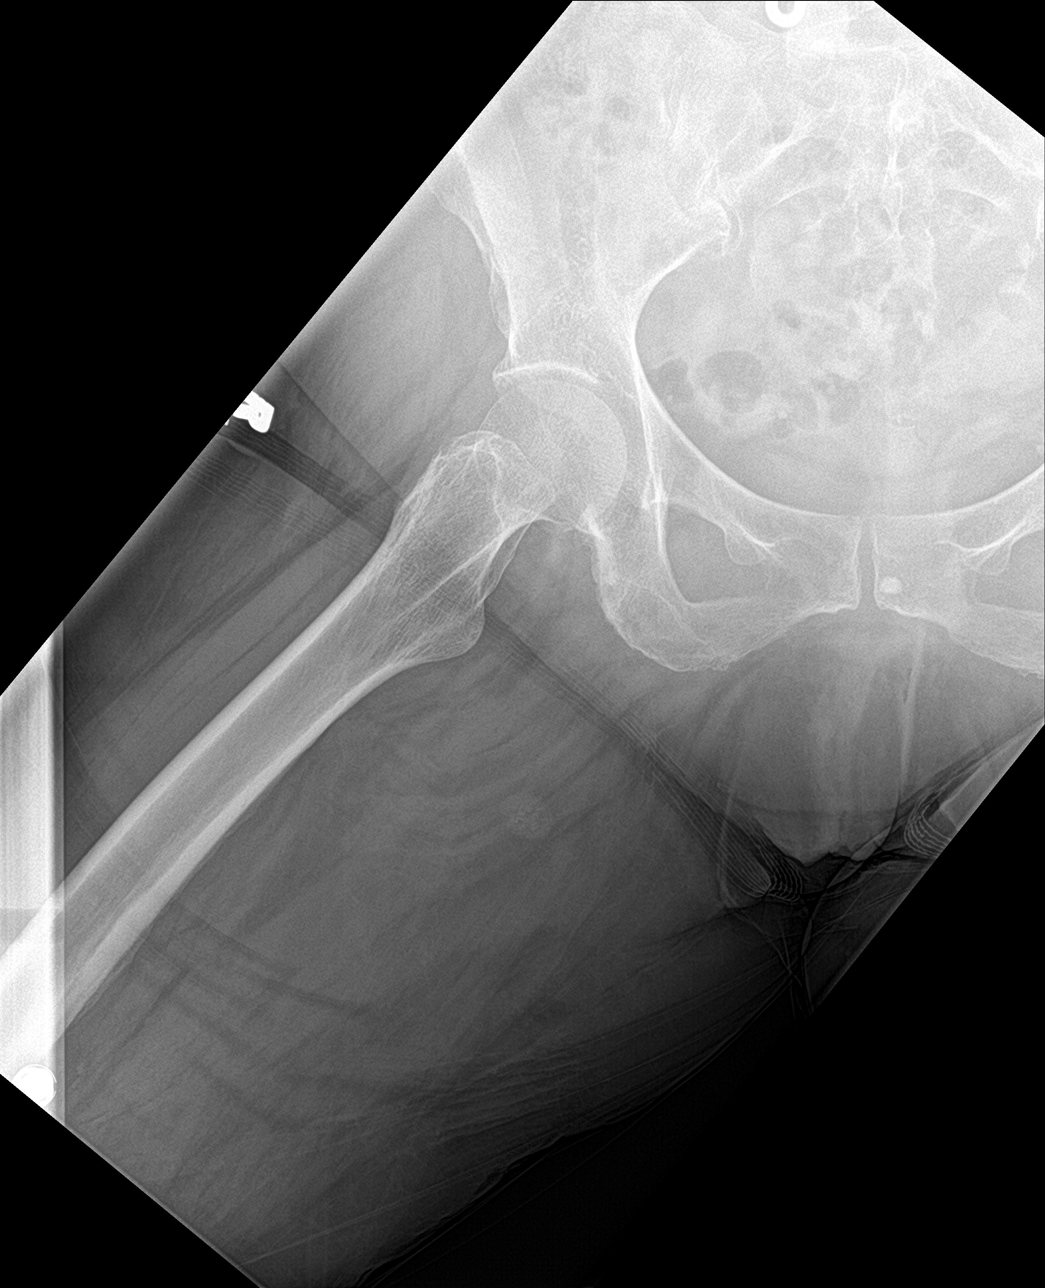

[femur lat (2 of 2)]
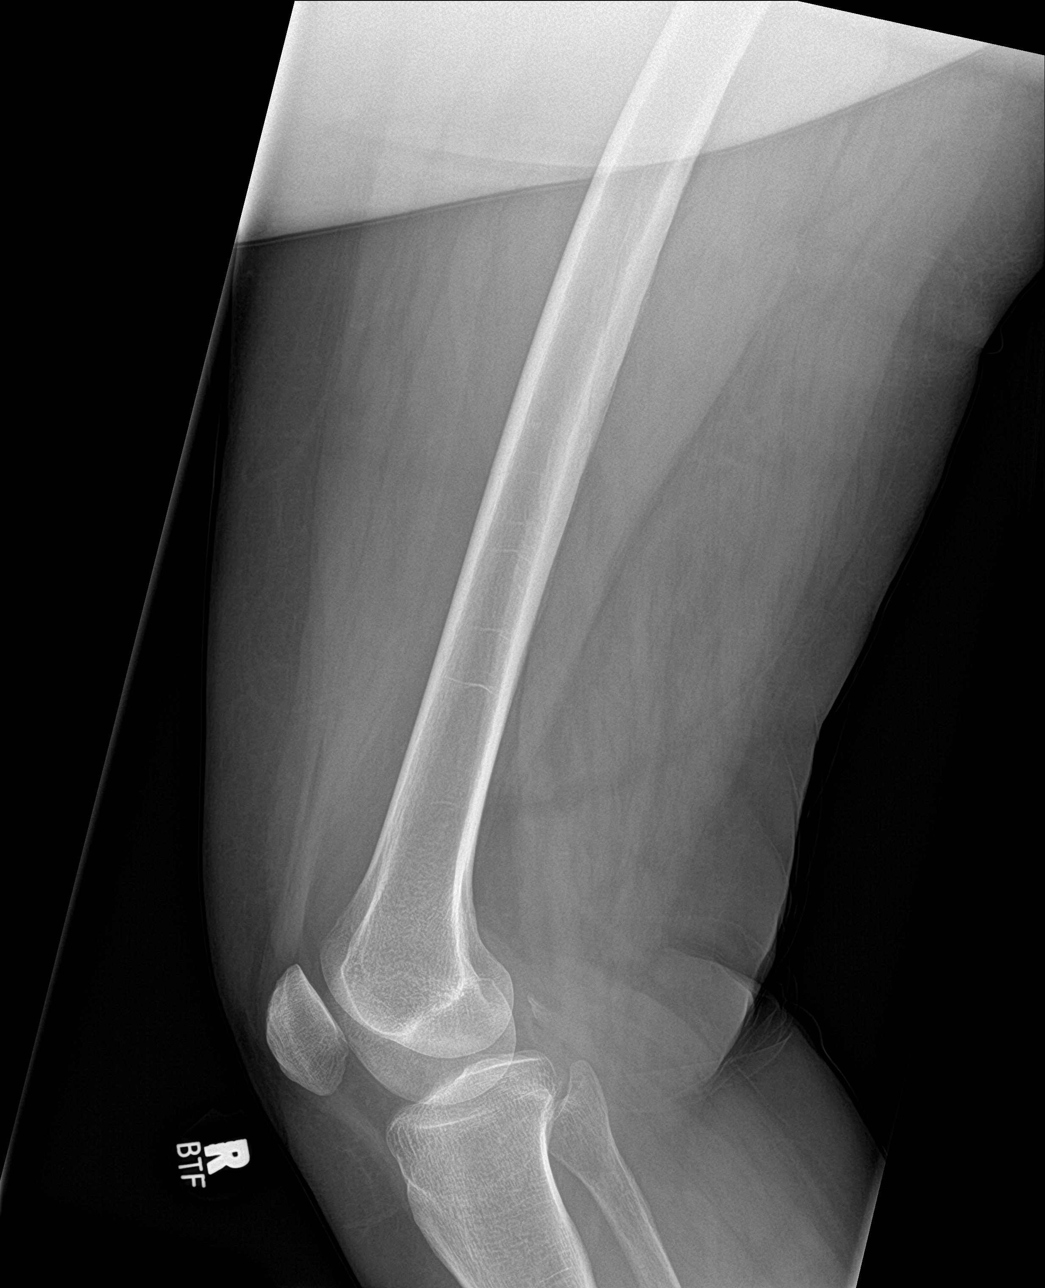

[4 of 4 positions shown; findings below may reference images not displayed]

FINDINGS: There is no evidence of fracture or other focal bone lesions. Soft
tissues are unremarkable.
IMPRESSION: No fracture or dislocation is seen in the right femur.

## 2023-06-03 IMAGING — DX DG PELVIS 1-2V
1 series · 1 of 1 positions shown · non-contrast
Comparison: None.

CLINICAL DATA: Pain right hip

EXAM:
PELVIS - 1-2 VIEW

[pelvis ap]
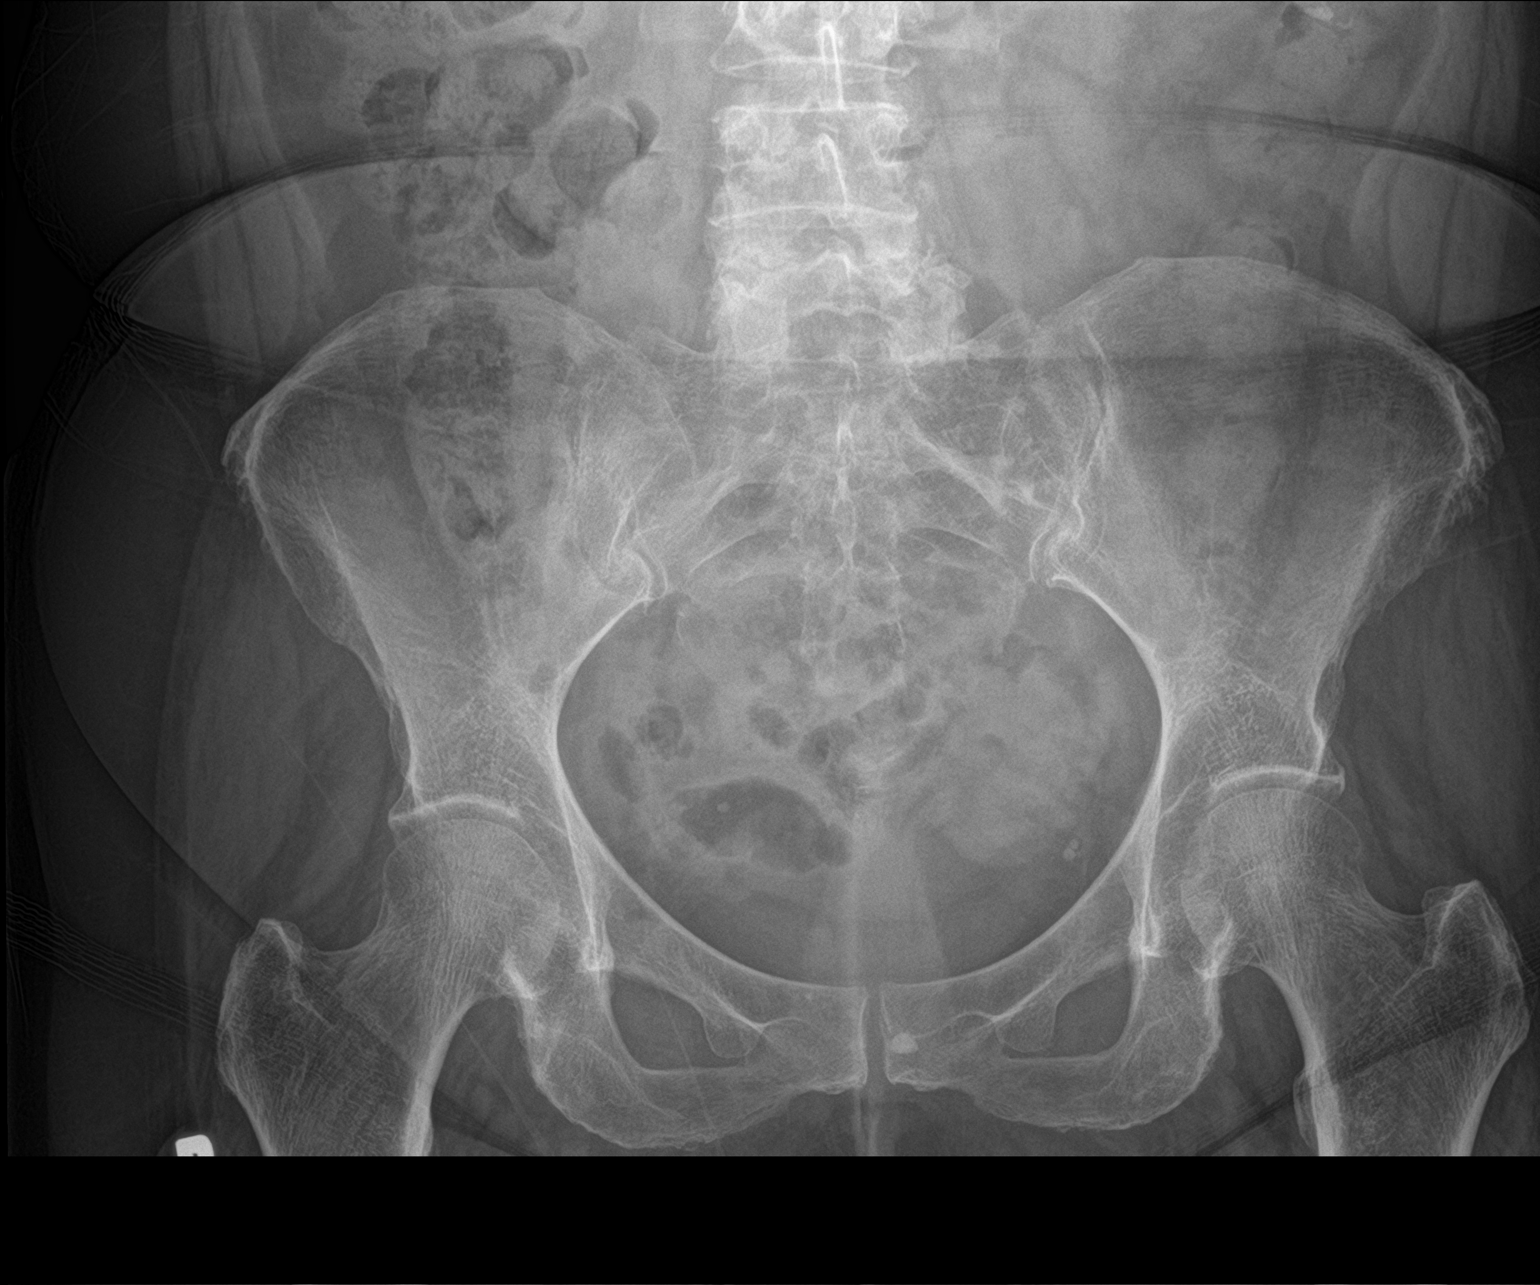

[1 of 1 positions shown; findings below may reference images not displayed]

FINDINGS: No fracture or dislocation is seen. Degenerative changes are noted
in the lower lumbar spine.
IMPRESSION: No fracture or dislocation is seen in the pelvis. Lumbar
spondylosis.

## 2023-06-12 ENCOUNTER — Ambulatory Visit: Payer: Medicare PPO | Admitting: Psychology

## 2023-06-12 DIAGNOSIS — F4323 Adjustment disorder with mixed anxiety and depressed mood: Secondary | ICD-10-CM

## 2023-06-12 NOTE — Progress Notes (Signed)
[facility] Counselor/Therapist Progress Note  Patient ID: Traci Strong, MRN: [id number]   Date: 06/12/23  Time Spent: 9:04 am - 9:51 am : 47 Minutes  Treatment Type: Individual Therapy.  Reported Symptoms: anxiety and depression.   Mental Status Exam: Appearance:  Casual     Behavior: Appropriate  Motor: Normal  Speech/Language:  Normal Rate  Affect: Congruent  Mood: normal  Thought process: normal  Thought content:   WNL  Sensory/Perceptual disturbances:   WNL  Orientation: oriented to person, place, time/date, and situation  Attention: Good  Concentration: Good  Memory: WNL  Fund of knowledge:  Good  Insight:   Good  Judgment:  Good  Impulse Control: Good   Risk Assessment: Danger to Self:  No Self-injurious Behavior: No Danger to Others: No Duty to Warn:no Physical Aggression / Violence:No  Access to Firearms a concern: No  Gang Involvement:No   Subjective:   Traci Strong participated from home, via video and consented to treatment.  Therapist participated from home office.  Traci Strong reviewed the events of the past week. Traci Strong noted improvement in her social anxiety as evidenced by attending a baby shower and speaking up at church, and attributed this to her progress in therapy. She noted diligently working on improving her health and noted losing weight via reducing soda consumption. She noted recently being assessed for allergies and noted being hopeful that her allergies will improve with new medications being prescribed. She noted a need to increase her exercise. We worked on processing possible barriers for this during the session and ways to address this going forward including identifying alternative exercises, exercises that can be done at home, and setting reasonable expectations. She noted feeling guilty about a statement her brother recently made about not being able to leave his setting often. We worked on exploring this during the session. We worked on  ways to reframe this experience and work on assuming best intent and challenging this jump to conclusion. Traci Strong was engaged and motivated during the session. She expressed commitment towards our goals. Therapist praised Traci Strong and provided supportive therapy. A Follow-up was scheduled for continued treatment.   Interventions: CBT  Diagnosis:  Adjustment disorder with mixed anxiety and depressed mood  Psychiatric Treatment: No , na   Treatment Plan:  Client Abilities/Strengths Traci Strong is intelligent, self-aware, and motivated for change.   Support System: Family and friends.   Client Treatment Preferences Outpatient therapy.   Client Statement of Needs Traci Strong would like to exercising consistently and frequently, eating more healthfully, address tasks at home, reconnect with family, manage symptoms, process past events, bolster coping skills, focusing on self.   Treatment Level Weekly  Symptoms  Anxiety: feeling anxious, difficulty managing worry, worrying too much, trouble relaxing, restlessness, feeling afraid something awful might happen.    (Status: maintained) Depression: Feeling down, fluctuating sleep, and lethargy.    (Status: maintained)  Goals:   Traci Strong experiences symptoms of depression and anxiety.   Treatment plan signed and available on s-drive:  No, pending signature.    Target Date: 04/04/2024 Frequency: Weekly  Progress: 15% Modality: individual    Therapist will provide referrals for additional resources as appropriate.  Therapist will provide psycho-education regarding Traci Strong's diagnosis and corresponding treatment approaches and interventions. Licensed Clinical Social Worker, [place], LCSW will support the patient's ability to achieve the goals identified. will employ CBT, BA, Problem-solving, Solution Focused, Mindfulness,  coping skills, & other evidenced-based practices will be used to promote progress towards healthy functioning to help  manage  decrease symptoms associated with her diagnosis.   Reduce overall level, frequency, and intensity of the feelings of depression, anxiety and panic evidenced by decreased overall symptoms from 6 to 7 days/week to 0 to 1 days/week per client report for at least 3 consecutive months. Verbally express understanding of the relationship between feelings of depression, anxiety and their impact on thinking patterns and behaviors. Verbalize an understanding of the role that distorted thinking plays in creating fears, excessive worry, and ruminations.    [name] participated in the creation of the treatment plan)    [name], LCSW

## 2023-06-25 DIAGNOSIS — L280 Lichen simplex chronicus: Secondary | ICD-10-CM | POA: Diagnosis not present

## 2023-06-25 DIAGNOSIS — L821 Other seborrheic keratosis: Secondary | ICD-10-CM | POA: Diagnosis not present

## 2023-06-27 ENCOUNTER — Ambulatory Visit: Payer: Medicare PPO | Admitting: Nurse Practitioner

## 2023-06-27 ENCOUNTER — Encounter: Payer: Self-pay | Admitting: Nurse Practitioner

## 2023-06-27 VITALS — BP 128/78 | HR 88 | Ht 60.75 in | Wt 168.0 lb

## 2023-06-27 DIAGNOSIS — N3000 Acute cystitis without hematuria: Secondary | ICD-10-CM

## 2023-06-27 DIAGNOSIS — R350 Frequency of micturition: Secondary | ICD-10-CM | POA: Diagnosis not present

## 2023-06-27 DIAGNOSIS — L439 Lichen planus, unspecified: Secondary | ICD-10-CM | POA: Diagnosis not present

## 2023-06-27 DIAGNOSIS — M810 Age-related osteoporosis without current pathological fracture: Secondary | ICD-10-CM | POA: Diagnosis not present

## 2023-06-27 DIAGNOSIS — N898 Other specified noninflammatory disorders of vagina: Secondary | ICD-10-CM

## 2023-06-27 LAB — WET PREP FOR TRICH, YEAST, CLUE

## 2023-06-27 MED ORDER — CLOBETASOL PROPIONATE 0.05 % EX OINT: 1.0000 | TOPICAL_OINTMENT | CUTANEOUS | 2 refills | Status: AC

## 2023-06-27 MED ORDER — RISEDRONATE SODIUM 150 MG PO TABS: 150.0000 mg | ORAL_TABLET | ORAL | 2 refills | Status: DC

## 2023-06-27 MED ORDER — SULFAMETHOXAZOLE-TRIMETHOPRIM 800-160 MG PO TABS
1.0000 | ORAL_TABLET | Freq: Two times a day (BID) | ORAL | 0 refills | Status: DC
Start: 2023-06-27 — End: 2023-07-22

## 2023-06-27 NOTE — Progress Notes (Signed)
   Acute Office Visit  Subjective:    Patient ID: Traci Strong, female    DOB: [birth year], 68 y.o.   MRN: [id number]   HPI 68 y.o. presents today for medication management, vaginal itching and UTI symptoms. Treated for UTI 2 weeks ago and feels she is still experiencing symptoms. Took course of Macrobid. Has been having intermittent vulvar itching  for a long time but has been worse this past month. Worse at night. Has been seeing allergist and dermatologist. Diagnosed with multiple skin diseases, 8 biopsies obtained. Lichen Planus is one. On Actonel for osteoporosis since 11/2017. DXA 02/06/2022 T-score -1.6 (-1.8 in 2021).   No LMP recorded. Patient is postmenopausal.    Review of Systems  Constitutional: Negative.   Genitourinary:  Positive for dysuria, frequency, genital sores and vaginal pain (Itching). Negative for flank pain, hematuria, pelvic pain, urgency and vaginal discharge.       Objective:    Physical Exam Constitutional:      Appearance: Normal appearance.  Genitourinary:    Labia:        Right: Rash present.        Left: Rash present.      Vagina: No vaginal discharge or erythema.     Cervix: Normal.     Comments: Multiple round, plaque-like lesions. Some pearly and some purple in color. Extending from mons to anus.     BP 128/78   Pulse 88   Ht 5' 0.75" (1.543 m)   Wt 168 lb (76.2 kg)   SpO2 100%   BMI 32.01 kg/m  Wt Readings from Last 3 Encounters:  06/27/23 168 lb (76.2 kg)  05/17/23 165 lb 3.2 oz (74.9 kg)  06/28/22 166 lb (75.3 kg)        Patient informed chaperone available to be present for breast and/or pelvic exam. Patient has requested no chaperone to be present. Patient has been advised what will be completed during breast and pelvic exam.   Wet prep negative for pathogens  UA: 2+ leukocytes, neg nitrites, neg blood, neg protein, yellow/clear. Microscopic: wbc 10-20, rbc none, no bacteria  Assessment & Plan:   Problem List Items  Addressed This Visit   None Visit Diagnoses       Acute cystitis without hematuria    -  Primary   Relevant Medications   sulfamethoxazole-trimethoprim (BACTRIM DS) 800-160 MG tablet     Age-related osteoporosis without current pathological fracture       Relevant Medications   risedronate (ACTONEL) 150 MG tablet   Other Relevant Orders   DG Bone Density     Frequency of urination       Relevant Orders   Urinalysis,Complete w/RFL Culture (Completed)     Vaginal itching       Relevant Orders   WET PREP FOR TRICH, YEAST, CLUE     Lichen planus       Relevant Medications   clobetasol ointment (TEMOVATE) 0.05 %      Plan: Acute cystitis - Bactrim 800-160 mg BID x 3 days. Culture pending. Wet prep negative. Suspicious for lichen planus on vulva - Clobetasol BID x 2 weeks, then decrease to daily x 2 weeks, then twice weekly. Refills provided on Acontel. Discussed length of use and current recommendations. Will schedule DXA for August. If stable will consider doing holiday.   Return in about 4 weeks (around 07/25/2023) for Follow up on vulvar lesions.    Olivia Mackie DNP, 10:48 AM 06/27/2023

## 2023-06-28 NOTE — Telephone Encounter (Signed)
 Medication PA received by fax from pharmacy on 06/27/2023 @ 10:34am.  PA initiated through CoverMyMeds:  Key: [id number]

## 2023-06-29 LAB — CULTURE INDICATED

## 2023-06-29 LAB — URINALYSIS, COMPLETE W/RFL CULTURE
Bacteria, UA: NONE SEEN /HPF
Bilirubin Urine: NEGATIVE
Casts: NONE SEEN /LPF
Crystals: NONE SEEN /HPF
Glucose, UA: NEGATIVE
Hgb urine dipstick: NEGATIVE
Ketones, ur: NEGATIVE
Nitrites, Initial: NEGATIVE
Protein, ur: NEGATIVE
RBC / HPF: NONE SEEN /HPF (ref 0–2)
Specific Gravity, Urine: 1.01 (ref 1.001–1.035)
Yeast: NONE SEEN /HPF
pH: 5.5 (ref 5.0–8.0)

## 2023-06-29 LAB — URINE CULTURE
MICRO NUMBER:: 16137413
Result:: NO GROWTH
SPECIMEN QUALITY:: ADEQUATE

## 2023-07-01 ENCOUNTER — Encounter: Payer: Self-pay | Admitting: Nurse Practitioner

## 2023-07-04 NOTE — Telephone Encounter (Signed)
 Paper request for PA for meds was received by pt's insurance for rx that the pt has already got. Paid OOP. Will disregard this request for now.

## 2023-07-11 ENCOUNTER — Other Ambulatory Visit: Payer: Self-pay | Admitting: Nurse Practitioner

## 2023-07-11 ENCOUNTER — Ambulatory Visit: Payer: Medicare PPO | Admitting: Psychology

## 2023-07-11 DIAGNOSIS — F4323 Adjustment disorder with mixed anxiety and depressed mood: Secondary | ICD-10-CM

## 2023-07-11 DIAGNOSIS — Z1231 Encounter for screening mammogram for malignant neoplasm of breast: Secondary | ICD-10-CM

## 2023-07-11 NOTE — Progress Notes (Signed)
 [facility] Counselor/Therapist Progress Note  Patient ID: Traci Strong, MRN: [id number]   Date: 07/11/23  Time Spent: 11:04 am - 11:58 am : 54  Minutes  Treatment Type: Individual Therapy.  Reported Symptoms: anxiety and depression.   Mental Status Exam: Appearance:  Casual     Behavior: Appropriate  Motor: Normal  Speech/Language:  Normal Rate  Affect: Congruent  Mood: anxious  Thought process: normal  Thought content:   WNL  Sensory/Perceptual disturbances:   WNL  Orientation: oriented to person, place, time/date, and situation  Attention: Good  Concentration: Good  Memory: WNL  Fund of knowledge:  Good  Insight:   Good  Judgment:  Good  Impulse Control: Good   Risk Assessment: Danger to Self:  No Self-injurious Behavior: No Danger to Others: No Duty to Warn:no Physical Aggression / Violence:No  Access to Firearms a concern: No  Gang Involvement:No   Subjective:   Traci Strong participated from home, via video and consented to treatment.  Therapist participated from home office.  Traci Strong reviewed the events of the past week. She noted being recently diagnosed with a skin condition that could take up to two years to resolve. She noted frustration with the insurance would not approve the prescribed treatment. She noted stress and anxiety regarding the current political climate and noted that the proposed cuts to medicare and social security would directly affects her. Additionally, many proposed cuts would affect her brother who depends on said benefits. She noted worry about her retirement accounts due to the fluctuations of the market. She noted worry about long-term viability of various proposed in this area, as well. We worked on processing this during the session. She noted her anxiety largely being manageable. She noted her efforts to set boundaries with others regarding political discussions and to limit her consumption of news. She noted that over consumption  can increase her anxiety. She noted working on identifying possible viable options should these concerns come to fruition. She provided an update regarding her brother's health and noted anxiety regarding his current health issues and anxiety that his cancer is progressing. We worked on processing this during the session and ways to manage this anxiety going forward. She noted that, day-to-day, she does not have much anxiety about his health. She noted her daughter recently asking her why she isn't depositing money into the children's account in lieu of gift giving. She noted her feelings regarding this during the session. We explored this during the session and Traci Strong noted feeling that this is a method of control by her daughter and her daughter has a history of this in the past. We explored this and worked on identifying viable approaches to address this concerns. Therapist modeled assertiveness and problem-solving during the session. Traci Strong was engaged and motivated during the session. She expressed commitment towards goals. Therapist validated Traci Strong's feelings and experience and provided supportive therapy.   Interventions: CBT  Diagnosis:  Adjustment disorder with mixed anxiety and depressed mood  Psychiatric Treatment: No , na   Treatment Plan:  Client Abilities/Strengths Traci Strong is intelligent, self-aware, and motivated for change.   Support System: Family and friends.   Client Treatment Preferences Outpatient therapy.   Client Statement of Needs Traci Strong would like to exercising consistently and frequently, eating more healthfully, address tasks at home, reconnect with family, manage symptoms, process past events, bolster coping skills, focusing on self.   Treatment Level Weekly  Symptoms  Anxiety: feeling anxious, difficulty managing worry, worrying too much, trouble relaxing,  restlessness, feeling afraid something awful might happen.    (Status: maintained) Depression: Feeling down,  fluctuating sleep, and lethargy.    (Status: maintained)  Goals:   Traci Strong experiences symptoms of depression and anxiety.   Treatment plan signed and available on s-drive:  No, pending signature.    Target Date: 04/04/2024 Frequency: Weekly  Progress: 15% Modality: individual    Therapist will provide referrals for additional resources as appropriate.  Therapist will provide psycho-education regarding Traci Strong's diagnosis and corresponding treatment approaches and interventions. Licensed Clinical Social Worker, [place], LCSW will support the patient's ability to achieve the goals identified. will employ CBT, BA, Problem-solving, Solution Focused, Mindfulness,  coping skills, & other evidenced-based practices will be used to promote progress towards healthy functioning to help manage decrease symptoms associated with her diagnosis.   Reduce overall level, frequency, and intensity of the feelings of depression, anxiety and panic evidenced by decreased overall symptoms from 6 to 7 days/week to 0 to 1 days/week per client report for at least 3 consecutive months. Verbally express understanding of the relationship between feelings of depression, anxiety and their impact on thinking patterns and behaviors. Verbalize an understanding of the role that distorted thinking plays in creating fears, excessive worry, and ruminations.    Traci Strong participated in the creation of the treatment plan)    [name], LCSW

## 2023-07-22 ENCOUNTER — Encounter: Payer: Self-pay | Admitting: Nurse Practitioner

## 2023-07-22 ENCOUNTER — Ambulatory Visit: Payer: Medicare PPO | Admitting: Nurse Practitioner

## 2023-07-22 VITALS — BP 130/80 | HR 63

## 2023-07-22 DIAGNOSIS — L439 Lichen planus, unspecified: Secondary | ICD-10-CM | POA: Diagnosis not present

## 2023-07-22 NOTE — Progress Notes (Signed)
   Acute Office Visit  Subjective:    Patient ID: Traci Strong, female    DOB: 10/02/1955, 68 y.o.   MRN: [id number]   HPI 68 y.o. presents today for 4-week follow up on vulvar lesions. Suspected lichen planus. Has experienced intermittent vulvar itching for along time, worse at night. Sees allergist and derm. Has been diagnosed with multiple skin diseases, lichen planus being one. Started Clobetasol at last visit. Has had full resolution of symptoms. Currently using ointment daily.   No LMP recorded. Patient is postmenopausal.    Review of Systems  Constitutional: Negative.   Genitourinary: Negative.        Objective:    Physical Exam Constitutional:      Appearance: Normal appearance.  Genitourinary:    Comments: Lesions resolved. 4 sebaceous cysts present (subsequent encounter)    BP 130/80 (BP Location: Left Arm, Patient Position: Sitting, Cuff Size: Small)   Pulse 63   SpO2 97%  Wt Readings from Last 3 Encounters:  06/27/23 168 lb (76.2 kg)  05/17/23 165 lb 3.2 oz (74.9 kg)  06/28/22 166 lb (75.3 kg)        Patient informed chaperone available to be present for breast and/or pelvic exam. Patient has requested no chaperone to be present. Patient has been advised what will be completed during breast and pelvic exam.   Assessment & Plan:   Problem List Items Addressed This Visit   None Visit Diagnoses       Lichen planus    -  Primary      Plan: Full resolution of lesions and symptoms. Decrease to every other day x 1 week, then twice weekly for maintenance.   Return if symptoms worsen or fail to improve.    Olivia Mackie DNP, 11:24 AM 07/22/2023

## 2023-08-07 DIAGNOSIS — L438 Other lichen planus: Secondary | ICD-10-CM | POA: Diagnosis not present

## 2023-08-07 DIAGNOSIS — L821 Other seborrheic keratosis: Secondary | ICD-10-CM | POA: Diagnosis not present

## 2023-08-08 ENCOUNTER — Ambulatory Visit: Admitting: Psychology

## 2023-08-08 DIAGNOSIS — F4323 Adjustment disorder with mixed anxiety and depressed mood: Secondary | ICD-10-CM | POA: Diagnosis not present

## 2023-08-08 NOTE — Progress Notes (Signed)
 [facility] Counselor/Therapist Progress Note  Patient ID: DIA DONATE, MRN: [id number]   Date: 08/08/23  Time Spent: 11:05 am - 11:48 am : 43  Minutes  Treatment Type: Individual Therapy.  Reported Symptoms: anxiety and depression.   Mental Status Exam: Appearance:  Casual     Behavior: Appropriate  Motor: Normal  Speech/Language:  Normal Rate  Affect: Congruent  Mood: anxious and dysthymic  Thought process: normal  Thought content:   WNL  Sensory/Perceptual disturbances:   WNL  Orientation: oriented to person, place, time/date, and situation  Attention: Good  Concentration: Good  Memory: WNL  Fund of knowledge:  Good  Insight:   Good  Judgment:  Good  Impulse Control: Good   Risk Assessment: Danger to Self:  No Self-injurious Behavior: No Danger to Others: No Duty to Warn:no Physical Aggression / Violence:No  Access to Firearms a concern: No  Gang Involvement:No   Subjective:   Serenity participated from home, via video and consented to treatment.  Therapist participated from home office.  Emree reviewed the events of the past week. She noted her brother's recent illness, a UTI, and noted his symptoms worsening. She noted him requiring hospitalization. She noted some improvement in his health but that he is still ill and requiring monitoring and treatment. She noted concern regarding his confusion and noted worry that his health with worsen. She noted typically being able to "handle things" but that "I don't handle it as well". She noted feeling anxious and breaking down in tears. She noted visiting him daily and noted her anxiety of "not knowing what he will be like".We worked on exploring this during the session. She noted receiving calls regarding her brother's insurance coverage and noted increased anxiety regarding this as well. She noted feeling alone, during this time, but noted some support from a close friend. She noted a lack of support from her  daughter who focused more on her uncle's medical status than supporting [place] emotionally. She noted the stressors of having to do her own taxes and her brother's taxes, during this time, as well. She noted her sleep being affected and noted working on this with her provider despite insurance barriers. She noted a need to improve her exercise and eating. We worked on identifying ways to address exercise and eating in small yet meaningful ways. She noted her intent to walk in the neighborhood and meal prepping meals and having access to foods that can be heated quickly. Therapist praised Saory for effort to address her needs and self-care during the session. She noted being a part of bible study and noted this being enjoyable. She noted her intent to do yard work, sit outside to enjoy the morning sun and keep busy. Therapist encouraged check-ins, reaching out for support, and focusing in areas that are fulfilling. Therapist validated Lashundra's feelings and experience and provided supportive therapy. A follow-up was scheduled for continued treatment, which Charlese benefits from.   Interventions: CBT  Diagnosis:  Adjustment disorder with mixed anxiety and depressed mood  Psychiatric Treatment: No , na   Treatment Plan:  Client Abilities/Strengths Tricia is intelligent, self-aware, and motivated for change.   Support System: Family and friends.   Client Treatment Preferences Outpatient therapy.   Client Statement of Needs Azizi would like to exercising consistently and frequently, eating more healthfully, address tasks at home, reconnect with family, manage symptoms, process past events, bolster coping skills, focusing on self.   Treatment Level Weekly  Symptoms  Anxiety: feeling  anxious, difficulty managing worry, worrying too much, trouble relaxing, restlessness, feeling afraid something awful might happen.    (Status: maintained) Depression: Feeling down, fluctuating sleep, and lethargy.     (Status: declined)  Goals:   Porsha experiences symptoms of depression and anxiety.   Treatment plan signed and available on s-drive:  No, pending signature.    Target Date: 04/04/2024 Frequency: Weekly  Progress: 15% Modality: individual    Therapist will provide referrals for additional resources as appropriate.  Therapist will provide psycho-education regarding Rupal's diagnosis and corresponding treatment approaches and interventions. Licensed Clinical Social Worker, [place], LCSW will support the patient's ability to achieve the goals identified. will employ CBT, BA, Problem-solving, Solution Focused, Mindfulness,  coping skills, & other evidenced-based practices will be used to promote progress towards healthy functioning to help manage decrease symptoms associated with her diagnosis.   Reduce overall level, frequency, and intensity of the feelings of depression, anxiety and panic evidenced by decreased overall symptoms from 6 to 7 days/week to 0 to 1 days/week per client report for at least 3 consecutive months. Verbally express understanding of the relationship between feelings of depression, anxiety and their impact on thinking patterns and behaviors. Verbalize an understanding of the role that distorted thinking plays in creating fears, excessive worry, and ruminations.    [name] participated in the creation of the treatment plan)    [name], LCSW

## 2023-08-27 NOTE — Progress Notes (Signed)
 [address] [place] [address] Dept: [phone]  FOLLOW UP NOTE  Patient ID: Traci Strong, female    DOB: 04-18-1956  Age: 68 y.o. MRN: [id number] Date of Office Visit: 08/28/2023  Assessment  Chief Complaint: Follow-up (Rashes are starting to go away. Would like to go over what Dermatology said/bloodwork. )  HPI Traci Strong is a 68 year old female who presents to clinic for follow-up visit.  She was last seen in this clinic on 05/29/2023 by Dr. Eleni Griffin for evaluation of rash and allergic rhinitis.    At today's visit, she reports her allergic rhinitis has been well controlled with occasional clear rhinorrhea as the main symptom for which she ontinues cetirizine  daily with relief of symptoms. Her last environmental allergy  skin testing was on 05/29/2023 was positive to grass pollen, weed pollen, tree pollen, indoor mold, outdoor mold, dust mite, and cat.  She continues to experience raised red rash occurring anywhere on her body. She has previously used clobetasol  with moderately relief of symptoms. She continues to follow up with her dermatology specialist. She is currently using Dermaleve 1-3 times a day with relief of symptoms. She is interested in moving forward with lab work to help evaluate the raised red areas. Skin biopsy from [facility] listed below.  Her current medications are listed in the chart.  Documentation review from her dermatologist noted below   Drug Allergies:  Allergies  Allergen Reactions   Amoxicillin-Pot Clavulanate Rash    Dry heaving   Codeine Other (See Comments)    Increase heart rate   Prednisone Rash    Dry heaving    Physical Exam: BP 130/88   Pulse 67   Temp (!) 97.5 F (36.4 C)   Resp 14   Wt 168 lb 6 oz (76.4 kg)   SpO2 96%   BMI 32.08 kg/m    Physical Exam Vitals reviewed.  Constitutional:      Appearance: Normal appearance.  HENT:     Head: Normocephalic and atraumatic.     Right Ear: Tympanic  membrane normal.     Left Ear: Tympanic membrane normal.     Nose:     Comments: Bilateral nares normal. Pharynx normal. Ears normal. Eyes normal    Mouth/Throat:     Pharynx: Oropharynx is clear.  Eyes:     Conjunctiva/sclera: Conjunctivae normal.  Cardiovascular:     Rate and Rhythm: Normal rate and regular rhythm.     Heart sounds: Normal heart sounds. No murmur heard. Pulmonary:     Effort: Pulmonary effort is normal.     Breath sounds: Normal breath sounds.     Comments: Lungs clear to auscultation Musculoskeletal:        General: Normal range of motion.     Cervical back: Normal range of motion and neck supple.  Skin:    General: Skin is warm and dry.     Comments: Scattered red, raised areas noted on her trunk, arms, and legs.   Neurological:     Mental Status: She is alert and oriented to person, place, and time.  Psychiatric:        Mood and Affect: Mood normal.        Behavior: Behavior normal.        Thought Content: Thought content normal.        Judgment: Judgment normal.     Assessment and Plan: 1. Idiopathic urticaria   2. Seasonal and perennial allergic rhinitis  Patient Instructions  Rash Continue twice daily moisturizing routine Continue Dermaleve as needed for red or itchy areas Continue clobetasol  only if needed Lab work has been ordered to help us  evaluate your raised, red, itchy areas. We will call you when the results become available  Allergic rhinitis Continue allergen avoidance measures directed toward grass pollen, weed pollen, tree pollen, indoor mold, outdoor mold, dust mite, and cat as listed below Continue Atrovent  2 sprays in each nostril up to twice a day if needed for runny nose Continue cetirizine  10 mg once a day if needed for runny nose or itch Consider saline nasal rinses as needed for nasal symptoms. Use this before any medicated nasal sprays for best result  Call the clinic if this treatment plan is not working well for  you.  Follow up in 6 months or sooner if needed.   Return in about 6 months (around 02/27/2024), or if symptoms worsen or fail to improve.    Thank you for the opportunity to care for this patient.  Please do not hesitate to contact me with questions.  Marinus Sic, FNP [facility] 

## 2023-08-27 NOTE — Patient Instructions (Incomplete)
 Rash Continue twice daily moisturizing routine Continue clobetasol   Allergic rhinitis Continue allergen avoidance measures directed toward grass pollen, weed pollen, tree pollen, indoor mold, outdoor mold, dust mite, and cat as listed below Continue Atrovent  2 sprays in each nostril up to twice a day if needed for runny nose Continue cetirizine  10 mg once a day if needed for runny nose or itch Consider saline nasal rinses as needed for nasal symptoms. Use this before any medicated nasal sprays for best result  Call the clinic if this treatment plan is not working well for you.  Follow up in *** or sooner if needed.  Reducing Pollen Exposure The American Academy of Allergy , Asthma and Immunology suggests the following steps to reduce your exposure to pollen during allergy  seasons. Do not hang sheets or clothing out to dry; pollen may collect on these items. Do not mow lawns or spend time around freshly cut grass; mowing stirs up pollen. Keep windows closed at night.  Keep car windows closed while driving. Minimize morning activities outdoors, a time when pollen counts are usually at their highest. Stay indoors as much as possible when pollen counts or humidity is high and on windy days when pollen tends to remain in the air longer. Use air conditioning when possible.  Many air conditioners have filters that trap the pollen spores. Use a HEPA room air filter to remove pollen form the indoor air you breathe.  Control of Mold Allergen Mold and fungi can grow on a variety of surfaces provided certain temperature and moisture conditions exist.  Outdoor molds grow on plants, decaying vegetation and soil.  The major outdoor mold, Alternaria and Cladosporium, are found in very high numbers during hot and dry conditions.  Generally, a late Summer - Fall peak is seen for common outdoor fungal spores.  Rain will temporarily lower outdoor mold spore count, but counts rise rapidly when the rainy period  ends.  The most important indoor molds are Aspergillus and Penicillium.  Dark, humid and poorly ventilated basements are ideal sites for mold growth.  The next most common sites of mold growth are the bathroom and the kitchen.  Outdoor [employer] Use air conditioning and keep windows closed Avoid exposure to decaying vegetation. Avoid leaf raking. Avoid grain handling. Consider wearing a face mask if working in moldy areas.  Indoor Mold Control Maintain humidity below 50%. Clean washable surfaces with 5% bleach solution. Remove sources e.g. Contaminated carpets.   Control of Dust Mite Allergen Dust mites play a major role in allergic asthma and rhinitis. They occur in environments with high humidity wherever human skin is found. Dust mites absorb humidity from the atmosphere (ie, they do not drink) and feed on organic matter (including shed human and animal skin). Dust mites are a microscopic type of insect that you cannot see with the naked eye. High levels of dust mites have been detected from mattresses, pillows, carpets, upholstered furniture, bed covers, clothes, soft toys and any woven material. The principal allergen of the dust mite is found in its feces. A gram of dust may contain 1,000 mites and 250,000 fecal particles. Mite antigen is easily measured in the air during house cleaning activities. Dust mites do not bite and do not cause harm to humans, other than by triggering allergies/asthma.  Ways to decrease your exposure to dust mites in your home:  1. Encase mattresses, box springs and pillows with a mite-impermeable barrier or cover  2. Wash sheets, blankets and drapes weekly in  hot water (130 F) with detergent and dry them in a dryer on the hot setting.  3. Have the room cleaned frequently with a vacuum cleaner and a damp dust-mop. For carpeting or rugs, vacuuming with a vacuum cleaner equipped with a high-efficiency particulate air (HEPA) filter. The dust mite allergic  individual should not be in a room which is being cleaned and should wait 1 hour after cleaning before going into the room.  4. Do not sleep on upholstered furniture (eg, couches).  5. If possible removing carpeting, upholstered furniture and drapery from the home is ideal. Horizontal blinds should be eliminated in the rooms where the person spends the most time (bedroom, study, television room). Washable vinyl, roller-type shades are optimal.  6. Remove all non-washable stuffed toys from the bedroom. Wash stuffed toys weekly like sheets and blankets above.  7. Reduce indoor humidity to less than 50%. Inexpensive humidity monitors can be purchased at most hardware stores. Do not use a humidifier as can make the problem worse and are not recommended.  Control of Dog or Cat Allergen Avoidance is the best way to manage a dog or cat allergy . If you have a dog or cat and are allergic to dog or cats, consider removing the dog or cat from the home. If you have a dog or cat but don't want to find it a new home, or if your family wants a pet even though someone in the household is allergic, here are some strategies that may help keep symptoms at bay:  Keep the pet out of your bedroom and restrict it to only a few rooms. Be advised that keeping the dog or cat in only one room will not limit the allergens to that room. Don't pet, hug or kiss the dog or cat; if you do, wash your hands with soap and water. High-efficiency particulate air (HEPA) cleaners run continuously in a bedroom or living room can reduce allergen levels over time. Regular use of a high-efficiency vacuum cleaner or a central vacuum can reduce allergen levels. Giving your dog or cat a bath at least once a week can reduce airborne allergen.

## 2023-08-28 ENCOUNTER — Encounter: Payer: Self-pay | Admitting: Family Medicine

## 2023-08-28 ENCOUNTER — Ambulatory Visit: Payer: Medicare PPO | Admitting: Family Medicine

## 2023-08-28 VITALS — BP 130/88 | HR 67 | Temp 97.5°F | Resp 14 | Wt 168.4 lb

## 2023-08-28 DIAGNOSIS — J302 Other seasonal allergic rhinitis: Secondary | ICD-10-CM | POA: Diagnosis not present

## 2023-08-28 DIAGNOSIS — J3089 Other allergic rhinitis: Secondary | ICD-10-CM | POA: Diagnosis not present

## 2023-08-28 DIAGNOSIS — L501 Idiopathic urticaria: Secondary | ICD-10-CM

## 2023-09-04 ENCOUNTER — Ambulatory Visit (INDEPENDENT_AMBULATORY_CARE_PROVIDER_SITE_OTHER): Admitting: Psychology

## 2023-09-04 DIAGNOSIS — F4323 Adjustment disorder with mixed anxiety and depressed mood: Secondary | ICD-10-CM | POA: Diagnosis not present

## 2023-09-04 NOTE — Progress Notes (Signed)
 [facility] Counselor/Therapist Progress Note  Patient ID: Traci Strong, MRN: [id number]   Date: 09/04/23  Time Spent: 10:02 am - 10:58 am : 56  Minutes  Treatment Type: Individual Therapy.  Reported Symptoms: anxiety and depression.   Mental Status Exam: Appearance:  Casual     Behavior: Appropriate  Motor: Normal  Speech/Language:  Normal Rate  Affect: Congruent  Mood: anxious  Thought process: normal  Thought content:   WNL  Sensory/Perceptual disturbances:   WNL  Orientation: oriented to person, place, time/date, and situation  Attention: Good  Concentration: Good  Memory: WNL  Fund of knowledge:  Good  Insight:   Good  Judgment:  Good  Impulse Control: Good   Risk Assessment: Danger to Self:  No Self-injurious Behavior: No Danger to Others: No Duty to Warn:no Physical Aggression / Violence:No  Access to Firearms a concern: No  Gang Involvement:No   Subjective:   Prudencia participated from home, via video and consented to treatment.  Therapist participated from home office.  Tammyjo reviewed the events of the past week. She noted a lack of alignment with her daughter regarding financial planning for her daughter, Lilyanne's grand-daughter. We worked on processing this during the session.  She noted her brother being placed in hospice. She noted some concerns regarding the possibility of her brother experiencing some "delusional" thinking. We worked on exploring this, her concerns about this experience, and worry that this could be negatively affected by this behavior. She noted difficulty managing her worry and rumination regarding this. We worked on delineating her concerns, her attempts to address this concern with her brother, and her interest in communicating concerns with the agency. We worked in identifying talking points, how to communicate concerns with brother with an approach that he can understand. We worked on doing the same for communicating concerns  with the agency. Therapist modeled this during the session. Therapist validated Meghann's feelings and experience and provided supportive therapy. A follow-up was scheduled for continued treatment, which she benefits from.   Interventions: CBT & interpersonal.   Diagnosis:  Adjustment disorder with mixed anxiety and depressed mood  Psychiatric Treatment: No , na   Treatment Plan:  Client Abilities/Strengths Cailah is intelligent, self-aware, and motivated for change.   Support System: Family and friends.   Client Treatment Preferences Outpatient therapy.   Client Statement of Needs Percy would like to exercising consistently and frequently, eating more healthfully, address tasks at home, reconnect with family, manage symptoms, process past events, bolster coping skills, focusing on self.   Treatment Level Weekly  Symptoms  Anxiety: feeling anxious, difficulty managing worry, worrying too much, trouble relaxing, restlessness, feeling afraid something awful might happen.    (Status: maintained) Depression: Feeling down, fluctuating sleep, and lethargy.    (Status: declined)  Goals:   Ermalee experiences symptoms of depression and anxiety.   Treatment plan signed and available on s-drive:  No, pending signature.    Target Date: 04/04/2024 Frequency: Weekly  Progress: 15% Modality: individual    Therapist will provide referrals for additional resources as appropriate.  Therapist will provide psycho-education regarding Riniyah's diagnosis and corresponding treatment approaches and interventions. Licensed Clinical Social Worker, [place], LCSW will support the patient's ability to achieve the goals identified. will employ CBT, BA, Problem-solving, Solution Focused, Mindfulness,  coping skills, & other evidenced-based practices will be used to promote progress towards healthy functioning to help manage decrease symptoms associated with her diagnosis.   Reduce overall level,  frequency, and intensity  of the feelings of depression, anxiety and panic evidenced by decreased overall symptoms from 6 to 7 days/week to 0 to 1 days/week per client report for at least 3 consecutive months. Verbally express understanding of the relationship between feelings of depression, anxiety and their impact on thinking patterns and behaviors. Verbalize an understanding of the role that distorted thinking plays in creating fears, excessive worry, and ruminations.    Sherline Distel participated in the creation of the treatment plan)    Belva Boyden, LCSW

## 2023-09-11 DIAGNOSIS — L821 Other seborrheic keratosis: Secondary | ICD-10-CM | POA: Diagnosis not present

## 2023-09-11 DIAGNOSIS — L438 Other lichen planus: Secondary | ICD-10-CM | POA: Diagnosis not present

## 2023-10-02 ENCOUNTER — Ambulatory Visit (INDEPENDENT_AMBULATORY_CARE_PROVIDER_SITE_OTHER): Admitting: Psychology

## 2023-10-02 DIAGNOSIS — F4323 Adjustment disorder with mixed anxiety and depressed mood: Secondary | ICD-10-CM | POA: Diagnosis not present

## 2023-10-02 NOTE — Progress Notes (Signed)
 [facility] Counselor/Therapist Progress Note  Patient ID: Traci Strong, MRN: [id number]   Date: 10/02/23  Time Spent: 10:05 am - 10:52 am : 47  Minutes  Treatment Type: Individual Therapy.  Reported Symptoms: anxiety and depression.   Mental Status Exam: Appearance:  Casual     Behavior: Appropriate  Motor: Normal  Speech/Language:  Normal Rate  Affect: Congruent  Mood: anxious  Thought process: normal  Thought content:   WNL  Sensory/Perceptual disturbances:   WNL  Orientation: oriented to person, place, time/date, and situation  Attention: Good  Concentration: Good  Memory: WNL  Fund of knowledge:  Good  Insight:   Good  Judgment:  Good  Impulse Control: Good   Risk Assessment: Danger to Self:  No Self-injurious Behavior: No Danger to Others: No Duty to Warn:no Physical Aggression / Violence:No  Access to Firearms a concern: No  Gang Involvement:No   Subjective:   Laquanda participated from home, via video and consented to treatment.  Therapist participated from home office.  Ariely reviewed the events of the past week.She noted increased contact from her daughter who doesn't typically contact her as much. She noted having to set boundaries with a friend who was "instant" about Ellen traveling with her, noted that her friend did not receive that well, though Cassidy noted a sense of pride for being assertive. She noted a recent conversation with her brother regarding boundaries with the care staff. She noted that he did not receive this well, overall.  She noted feeling overwhelmed and confused by her brother's behavior. We worked on exploring this during the session. She noted having a conversation with the care staff but noted a possible lack of understanding of her brothers needs due to his mental health concerns, primarily autism. Janeah worked on identifying ways to communicate concerns assertively and directly. We practices this during the session. Deshaun was  engaged and motivated during the session. She expressed commitment towards goals. A follow-up was scheduled for continued treatment.   Interventions: CBT & interpersonal.   Diagnosis:  Adjustment disorder with mixed anxiety and depressed mood  Psychiatric Treatment: No , na   Treatment Plan:  Client Abilities/Strengths Denina is intelligent, self-aware, and motivated for change.   Support System: Family and friends.   Client Treatment Preferences Outpatient therapy.   Client Statement of Needs Elektra would like to exercising consistently and frequently, eating more healthfully, address tasks at home, reconnect with family, manage symptoms, process past events, bolster coping skills, focusing on self.   Treatment Level Weekly  Symptoms  Anxiety: feeling anxious, difficulty managing worry, worrying too much, trouble relaxing, restlessness, feeling afraid something awful might happen.    (Status: maintained) Depression: Feeling down, fluctuating sleep, and lethargy.    (Status: declined)  Goals:   Jaelah experiences symptoms of depression and anxiety.   Treatment plan signed and available on s-drive:  No, pending signature.    Target Date: 04/04/2024 Frequency: Weekly  Progress: 15% Modality: individual    Therapist will provide referrals for additional resources as appropriate.  Therapist will provide psycho-education regarding Meridee's diagnosis and corresponding treatment approaches and interventions. Licensed Clinical Social Worker, [place], LCSW will support the patient's ability to achieve the goals identified. will employ CBT, BA, Problem-solving, Solution Focused, Mindfulness,  coping skills, & other evidenced-based practices will be used to promote progress towards healthy functioning to help manage decrease symptoms associated with her diagnosis.   Reduce overall level, frequency, and intensity of the feelings of depression,  anxiety and panic evidenced by  decreased overall symptoms from 6 to 7 days/week to 0 to 1 days/week per client report for at least 3 consecutive months. Verbally express understanding of the relationship between feelings of depression, anxiety and their impact on thinking patterns and behaviors. Verbalize an understanding of the role that distorted thinking plays in creating fears, excessive worry, and ruminations.    Sherline Distel participated in the creation of the treatment plan)    Belva Boyden, LCSW

## 2023-10-03 DIAGNOSIS — Z79899 Other long term (current) drug therapy: Secondary | ICD-10-CM | POA: Diagnosis not present

## 2023-10-03 DIAGNOSIS — R7301 Impaired fasting glucose: Secondary | ICD-10-CM | POA: Diagnosis not present

## 2023-10-03 DIAGNOSIS — N1831 Chronic kidney disease, stage 3a: Secondary | ICD-10-CM | POA: Diagnosis not present

## 2023-10-03 DIAGNOSIS — E559 Vitamin D deficiency, unspecified: Secondary | ICD-10-CM | POA: Diagnosis not present

## 2023-10-03 DIAGNOSIS — L501 Idiopathic urticaria: Secondary | ICD-10-CM | POA: Diagnosis not present

## 2023-10-03 DIAGNOSIS — I10 Essential (primary) hypertension: Secondary | ICD-10-CM | POA: Diagnosis not present

## 2023-10-16 LAB — CMP14+EGFR
ALT: 14 IU/L (ref 0–32)
AST: 20 IU/L (ref 0–40)
Albumin: 4.2 g/dL (ref 3.9–4.9)
Alkaline Phosphatase: 79 IU/L (ref 44–121)
BUN/Creatinine Ratio: 13 (ref 12–28)
BUN: 16 mg/dL (ref 8–27)
Bilirubin Total: 0.2 mg/dL (ref 0.0–1.2)
CO2: 19 mmol/L — ABNORMAL LOW (ref 20–29)
Calcium: 9.6 mg/dL (ref 8.7–10.3)
Chloride: 104 mmol/L (ref 96–106)
Creatinine, Ser: 1.19 mg/dL — ABNORMAL HIGH (ref 0.57–1.00)
Globulin, Total: 2.3 g/dL (ref 1.5–4.5)
Glucose: 106 mg/dL — ABNORMAL HIGH (ref 70–99)
Potassium: 5 mmol/L (ref 3.5–5.2)
Sodium: 142 mmol/L (ref 134–144)
Total Protein: 6.5 g/dL (ref 6.0–8.5)
eGFR: 49 mL/min/{1.73_m2} — ABNORMAL LOW (ref >59)

## 2023-10-16 LAB — CHRONIC URTICARIA PD-BAT: Pooled Donor- BAT CU: 7.4 % (ref 0.00–10.60)

## 2023-10-16 LAB — CBC WITH DIFFERENTIAL/PLATELET
Basophils Absolute: 0.1 10*3/uL (ref 0.0–0.2)
Basos: 1 %
EOS (ABSOLUTE): 0.1 10*3/uL (ref 0.0–0.4)
Eos: 2 %
Hematocrit: 44.4 % (ref 34.0–46.6)
Hemoglobin: 14.7 g/dL (ref 11.1–15.9)
Immature Grans (Abs): 0 10*3/uL (ref 0.0–0.1)
Immature Granulocytes: 0 %
Lymphocytes Absolute: 1.6 10*3/uL (ref 0.7–3.1)
Lymphs: 22 %
MCH: 33.4 pg — ABNORMAL HIGH (ref 26.6–33.0)
MCHC: 33.1 g/dL (ref 31.5–35.7)
MCV: 101 fL — ABNORMAL HIGH (ref 79–97)
Monocytes Absolute: 0.6 10*3/uL (ref 0.1–0.9)
Monocytes: 8 %
Neutrophils Absolute: 4.9 10*3/uL (ref 1.4–7.0)
Neutrophils: 67 %
Platelets: 295 10*3/uL (ref 150–450)
RBC: 4.4 x10E6/uL (ref 3.77–5.28)
RDW: 12.2 % (ref 11.7–15.4)
WBC: 7.3 10*3/uL (ref 3.4–10.8)

## 2023-10-16 LAB — ALPHA-GAL PANEL
Allergen Lamb IgE: <0.1 kU/L
Beef IgE: <0.1 kU/L
IgE (Immunoglobulin E), Serum: 72 [IU]/mL (ref 6–495)
O215-IgE Alpha-Gal: <0.1 kU/L
Pork IgE: <0.1 kU/L

## 2023-10-16 LAB — THYROID PEROXIDASE ANTIBODY: Thyroperoxidase Ab SerPl-aCnc: 26 [IU]/mL (ref 0–34)

## 2023-10-16 LAB — TSH+T4F+T3FREE
Free T4: 1.26 ng/dL (ref 0.82–1.77)
T3, Free: 3.7 pg/mL (ref 2.0–4.4)
TSH: 1.57 u[IU]/mL (ref 0.450–4.500)

## 2023-10-16 LAB — THYROGLOBULIN ANTIBODY: Thyroglobulin Antibody: <1 [IU]/mL (ref 0.0–0.9)

## 2023-10-17 ENCOUNTER — Ambulatory Visit: Payer: Self-pay | Admitting: Family Medicine

## 2023-10-17 NOTE — Progress Notes (Signed)
 Can you please let this patient know that her lab work has resulted. Complete blood count is essentially normal. Small change noted with red blood cells-Please send to PCP. Complete metabolic panel indicates a decline in creatinine and GFR- Please sent to PCP. Thyroid  studies were within normal limits. Chronic urticaria panel which looks for an auto-immune cause of hives was normal. Alpha gal panel which looks for red meat allergy  was normal. Can you please ask her if she is still having hives? Thank you

## 2023-10-30 ENCOUNTER — Ambulatory Visit (INDEPENDENT_AMBULATORY_CARE_PROVIDER_SITE_OTHER): Admitting: Psychology

## 2023-10-30 DIAGNOSIS — I10 Essential (primary) hypertension: Secondary | ICD-10-CM | POA: Diagnosis not present

## 2023-10-30 DIAGNOSIS — F4323 Adjustment disorder with mixed anxiety and depressed mood: Secondary | ICD-10-CM

## 2023-10-30 DIAGNOSIS — N183 Chronic kidney disease, stage 3 unspecified: Secondary | ICD-10-CM | POA: Diagnosis not present

## 2023-10-30 DIAGNOSIS — K2100 Gastro-esophageal reflux disease with esophagitis, without bleeding: Secondary | ICD-10-CM | POA: Diagnosis not present

## 2023-10-30 NOTE — Progress Notes (Signed)
 [facility] Counselor/Therapist Progress Note  Patient ID: Traci Strong, MRN: [id number]   Date: 10/30/23  Time Spent: 10:04 am - 10:50  am : 46 Minutes  Treatment Type: Individual Therapy.  Reported Symptoms: anxiety and depression.   Mental Status Exam: Appearance:  Casual     Behavior: Appropriate  Motor: Normal  Speech/Language:  Normal Rate  Affect: Congruent  Mood: normal  Thought process: normal  Thought content:   WNL  Sensory/Perceptual disturbances:   WNL  Orientation: oriented to person, place, time/date, and situation  Attention: Good  Concentration: Good  Memory: WNL  Fund of knowledge:  Good  Insight:   Good  Judgment:  Good  Impulse Control: Good   Risk Assessment: Danger to Self:  No Self-injurious Behavior: No Danger to Others: No Duty to Warn:no Physical Aggression / Violence:No  Access to Firearms a concern: No  Gang Involvement:No   Subjective:   Traci Strong participated from home, via video and consented to treatment.  Therapist participated from home office.  Traci Strong reviewed the events of the past week.She noted some improvement in her health and she noted gratitude for this. She noted a recent visit with her daughter and noted this going well, overall. She noted her brother's health improving slightly, overall. She noted staying busy and getting household tasks completed. She noted a lack of exercise due to the weather but noted the possibility of walking at a local mall. She noted her effort to eat healthfully and noted limiting her caloric intake. She noted her interest in working in a position to give feedback to adult placements regarding processes and behavior of staff. She noted volunteering for a task as church but noted some anxiety regarding this. We processed this during the session. She noted often engaging in activities, such as visiting museums, on her own but denied any interest in companionship. She noted being open to dating but  worries that she would compare who she is dating to her past partner, [name], who passed after an illness. She noted her interest in engaging in a variety of activities, including visiting the senior center but noted worry that they would be judgmental. We worked on exploring this during the session and therapist highlighted the presence of anxiety and jumps to conclusions.  Therapist encouraged Traci Strong to challenge this and be mindful of this going forward and to use evidence to do so. Therapist validated Traci Strong's feelings and experience and provided supportive therapy. A follow-up was scheduled for continued treatment which she benefits from.     Interventions: CBT & interpersonal.   Diagnosis:  Adjustment disorder with mixed anxiety and depressed mood  Psychiatric Treatment: No , na   Treatment Plan:  Client Abilities/Strengths Traci Strong is intelligent, self-aware, and motivated for change.   Support System: Family and friends.   Client Treatment Preferences Outpatient therapy.   Client Statement of Needs Traci Strong would like to exercising consistently and frequently, eating more healthfully, address tasks at home, reconnect with family, manage symptoms, process past events, bolster coping skills, focusing on self.   Treatment Level Weekly  Symptoms  Anxiety: feeling anxious, difficulty managing worry, worrying too much, trouble relaxing, restlessness, feeling afraid something awful might happen.    (Status: maintained) Depression: Feeling down, fluctuating sleep, and lethargy.    (Status: declined)  Goals:   Traci Strong experiences symptoms of depression and anxiety.   Treatment plan signed and available on s-drive:  No, pending signature.    Target Date: 04/04/2024 Frequency: Weekly  Progress:  15% Modality: individual    Therapist will provide referrals for additional resources as appropriate.  Therapist will provide psycho-education regarding Traci Strong diagnosis and corresponding  treatment approaches and interventions. Licensed Clinical Social Worker, [place], LCSW will support the patient's ability to achieve the goals identified. will employ CBT, BA, Problem-solving, Solution Focused, Mindfulness,  coping skills, & other evidenced-based practices will be used to promote progress towards healthy functioning to help manage decrease symptoms associated with her diagnosis.   Reduce overall level, frequency, and intensity of the feelings of depression, anxiety and panic evidenced by decreased overall symptoms from 6 to 7 days/week to 0 to 1 days/week per client report for at least 3 consecutive months. Verbally express understanding of the relationship between feelings of depression, anxiety and their impact on thinking patterns and behaviors. Verbalize an understanding of the role that distorted thinking plays in creating fears, excessive worry, and ruminations.    Traci Strong participated in the creation of the treatment plan)    Elvie Mullet, LCSW

## 2023-12-05 ENCOUNTER — Ambulatory Visit (INDEPENDENT_AMBULATORY_CARE_PROVIDER_SITE_OTHER): Admitting: Psychology

## 2023-12-05 DIAGNOSIS — F4323 Adjustment disorder with mixed anxiety and depressed mood: Secondary | ICD-10-CM | POA: Diagnosis not present

## 2023-12-05 NOTE — Progress Notes (Signed)
  [facility] Counselor/Therapist Progress Note  Patient ID: MAKARA LANZO, MRN: [id number]   Date: 12/05/23  Time Spent: 10:04 am - 10:55 am :  Treatment Type: Individual Therapy.  Reported Symptoms: anxiety and depression.   Mental Status Exam: Appearance:  Casual     Behavior: Appropriate  Motor: Normal  Speech/Language:  Normal Rate  Affect: Congruent  Mood: anxious  Thought process: normal  Thought content:   WNL  Sensory/Perceptual disturbances:   WNL  Orientation: oriented to person, place, time/date, and situation  Attention: Good  Concentration: Good  Memory: WNL  Fund of knowledge:  Good  Insight:   Good  Judgment:  Good  Impulse Control: Good   Risk Assessment: Danger to Self:  No Self-injurious Behavior: No Danger to Others: No Duty to Warn:no Physical Aggression / Violence:No  Access to Firearms a concern: No  Gang Involvement:No   Subjective:   Danaye participated from home, via video and consented to treatment.  Therapist participated from home office.  Arlyn reviewed the events of the past week. She noted reading a phrase about parenting that resonated with her. She noted her relationship with her daughter has improved and noted her effort to further improve the relationship. She noted the strain that she has had with her daughter and noted her daughter being, at times, difficult to deal with. She noted sending this phrase to her daughter, as an attempt to acknowledge past history and events. She noted recently experiencing a migraine and noted that tension could have been a precipitating factor. She noted following up with her providers for guidance.  She noted her brother's worsening health and noted her rising anxiety regarding this. She noted his history of health related complications. She noted her efforts to care for him and alert his care-takers of his presentation. She noted feeling dismissed by his nurse as she brought up her  concerns. She noted frustration regarding this and noted the dynamics with the specific nurse. We worked on processing her feelings and Tayona noted her effort to advocate for her bother. Therapist praised Mi for her effort to communicate needs, advocate for brother, and problem-solve. She noted her best friend not being as supportive regarding Chariti's brother's health and noted focusing on other relationships that are more supportive. Therapist validated Mya's feelings and experience during the session. Therapist provided supportive therapy. A follow-up was scheduled for continued treatment which she benefits from.   Interventions: CBT & interpersonal.   Diagnosis:  Adjustment disorder with mixed anxiety and depressed mood  Psychiatric Treatment: No , na   Treatment Plan:  Client Abilities/Strengths Zoriyah is intelligent, self-aware, and motivated for change.   Support System: Family and friends.   Client Treatment Preferences Outpatient therapy.   Client Statement of Needs Armoni would like to exercising consistently and frequently, eating more healthfully, address tasks at home, reconnect with family, manage symptoms, process past events, bolster coping skills, focusing on self.   Treatment Level Weekly  Symptoms  Anxiety: feeling anxious, difficulty managing worry, worrying too much, trouble relaxing, restlessness, feeling afraid something awful might happen.    (Status: maintained) Depression: Feeling down, fluctuating sleep, and lethargy.    (Status: declined)  Goals:   Billy experiences symptoms of depression and anxiety.   Treatment plan signed and available on s-drive:  No, pending signature.    Target Date: 04/04/2024 Frequency: Weekly  Progress: 15% Modality: individual    Therapist will provide referrals for additional resources as appropriate.  Therapist will  provide psycho-education regarding Providencia's diagnosis and corresponding treatment approaches and  interventions. Licensed Clinical Social Worker, [place], LCSW will support the patient's ability to achieve the goals identified. will employ CBT, BA, Problem-solving, Solution Focused, Mindfulness,  coping skills, & other evidenced-based practices will be used to promote progress towards healthy functioning to help manage decrease symptoms associated with her diagnosis.   Reduce overall level, frequency, and intensity of the feelings of depression, anxiety and panic evidenced by decreased overall symptoms from 6 to 7 days/week to 0 to 1 days/week per client report for at least 3 consecutive months. Verbally express understanding of the relationship between feelings of depression, anxiety and their impact on thinking patterns and behaviors. Verbalize an understanding of the role that distorted thinking plays in creating fears, excessive worry, and ruminations.    [name] participated in the creation of the treatment plan)    Elvie Mullet, LCSW

## 2023-12-12 DIAGNOSIS — L538 Other specified erythematous conditions: Secondary | ICD-10-CM | POA: Diagnosis not present

## 2023-12-12 DIAGNOSIS — L2989 Other pruritus: Secondary | ICD-10-CM | POA: Diagnosis not present

## 2023-12-12 DIAGNOSIS — R208 Other disturbances of skin sensation: Secondary | ICD-10-CM | POA: Diagnosis not present

## 2023-12-12 DIAGNOSIS — L438 Other lichen planus: Secondary | ICD-10-CM | POA: Diagnosis not present

## 2023-12-12 DIAGNOSIS — L821 Other seborrheic keratosis: Secondary | ICD-10-CM | POA: Diagnosis not present

## 2023-12-12 DIAGNOSIS — L820 Inflamed seborrheic keratosis: Secondary | ICD-10-CM | POA: Diagnosis not present

## 2023-12-23 ENCOUNTER — Ambulatory Visit (INDEPENDENT_AMBULATORY_CARE_PROVIDER_SITE_OTHER): Admitting: Psychology

## 2023-12-23 DIAGNOSIS — F4323 Adjustment disorder with mixed anxiety and depressed mood: Secondary | ICD-10-CM

## 2023-12-23 NOTE — Progress Notes (Signed)
 [facility] Counselor/Therapist Progress Note  Patient ID: Traci Strong, MRN: [id number]   Date: 12/23/23  Time Spent: 3:33 pm - 4:19 pm : 46 Minutes  Treatment Type: Individual Therapy.  Reported Symptoms: anxiety and depression.   Mental Status Exam: Appearance:  Casual     Behavior: Appropriate  Motor: Normal  Speech/Language:  Normal Rate  Affect: Congruent  Mood: anxious  Thought process: normal  Thought content:   WNL  Sensory/Perceptual disturbances:   WNL  Orientation: oriented to person, place, time/date, and situation  Attention: Good  Concentration: Good  Memory: WNL  Fund of knowledge:  Good  Insight:   Good  Judgment:  Good  Impulse Control: Good   Risk Assessment: Danger to Self:  No Self-injurious Behavior: No Danger to Others: No Duty to Warn:no Physical Aggression / Violence:No  Access to Firearms a concern: No  Gang Involvement:No   Subjective:   Traci Strong participated from home, via video and consented to treatment.  Therapist participated from home office.  Traci Strong reviewed the events of the past week. Traci Strong noted the loss of her older brother. She noted his history of health concerns and noted recently experiencing a UTI. She noted visiting him during what turned out to be a health related emergency that required hospitalization shortly before he passed. She relayed the events prior to his passing during the session. She noted receiving feedback he passed quickly. She noted quickly having to make calls to inform family members and make funeral arrangements. Therapist validated Traci Strong's feelings of sadness and grief. We worked on reviewing her current mood and functioning and reviewed the importance of self-care including sleep, exercise, and healthful eating. She noted struggling with sleep and eating but noted her efforts to manage this. She noted feeling support from family and friends. She noted her intent to continue her efforts to continue  accepting invitations from others. She noted signing up for a grief group via hospice. Therapist validated Traci Strong's feelings and experience of grief. She noted her intent to visit family soon. She noted her intent to get a grief book. Therapist provided psycho-education regarding grief and loss. Therapist validated Traci Strong's feelings and experience, encouraged self-care, maintaining support from others.  A follow-up was scheduled for continued treatment, which Traci Strong benefits from.   Interventions: grief and loss.   Diagnosis:  Adjustment disorder with mixed anxiety and depressed mood  Psychiatric Treatment: No , na   Treatment Plan:  Client Abilities/Strengths Traci Strong is intelligent, self-aware, and motivated for change.   Support System: Family and friends.   Client Treatment Preferences Outpatient therapy.   Client Statement of Needs Traci Strong would like to exercising consistently and frequently, eating more healthfully, address tasks at home, reconnect with family, manage symptoms, process past events, bolster coping skills, focusing on self.   Treatment Level Weekly  Symptoms  Anxiety: feeling anxious, difficulty managing worry, worrying too much, trouble relaxing, restlessness, feeling afraid something awful might happen.    (Status: maintained) Depression: Feeling down, fluctuating sleep, and lethargy.    (Status: declined)  Goals:   [name] experiences symptoms of depression and anxiety.   Treatment plan signed and available on s-drive:  No, pending signature.    Target Date: 04/04/2024 Frequency: Weekly  Progress: 15% Modality: individual    Therapist will provide referrals for additional resources as appropriate.  Therapist will provide psycho-education regarding Novi's diagnosis and corresponding treatment approaches and interventions. Licensed Clinical Social Worker, [place], LCSW will support the patient's ability to achieve the  goals identified. will employ CBT,  BA, Problem-solving, Solution Focused, Mindfulness,  coping skills, & other evidenced-based practices will be used to promote progress towards healthy functioning to help manage decrease symptoms associated with her diagnosis.   Reduce overall level, frequency, and intensity of the feelings of depression, anxiety and panic evidenced by decreased overall symptoms from 6 to 7 days/week to 0 to 1 days/week per client report for at least 3 consecutive months. Verbally express understanding of the relationship between feelings of depression, anxiety and their impact on thinking patterns and behaviors. Verbalize an understanding of the role that distorted thinking plays in creating fears, excessive worry, and ruminations.    [name] participated in the creation of the treatment plan)    Traci Strong Mullet, LCSW

## 2024-01-02 ENCOUNTER — Ambulatory Visit (INDEPENDENT_AMBULATORY_CARE_PROVIDER_SITE_OTHER): Admitting: Psychology

## 2024-01-02 DIAGNOSIS — F4323 Adjustment disorder with mixed anxiety and depressed mood: Secondary | ICD-10-CM | POA: Diagnosis not present

## 2024-01-02 NOTE — Progress Notes (Signed)
 [facility] Counselor/Therapist Progress Note  Patient ID: Traci Strong, MRN: [id number]   Date: 01/02/24  Time Spent: 10:03 am - 10:51 am :48 Minutes  Treatment Type: Individual Therapy.  Reported Symptoms: anxiety and depression.   Mental Status Exam: Appearance:  Casual     Behavior: Appropriate  Motor: Normal  Speech/Language:  Normal Rate  Affect: Congruent  Mood: anxious  Thought process: normal  Thought content:   WNL  Sensory/Perceptual disturbances:   WNL  Orientation: oriented to person, place, time/date, and situation  Attention: Good  Concentration: Good  Memory: WNL  Fund of knowledge:  Good  Insight:   Good  Judgment:  Good  Impulse Control: Good   Risk Assessment: Danger to Self:  No Self-injurious Behavior: No Danger to Others: No Duty to Warn:no Physical Aggression / Violence:No  Access to Firearms a concern: No  Gang Involvement:No   Subjective:   Traci Strong participated from home, via video and consented to treatment.  Therapist participated from home office.  Traci Strong reviewed the events of the past week. Traci Strong noted the loss of a cousin shortly after the loss of her brother. She noted debating whether to attend this funeral and noted worry that she would become too emotional and that this might distract others as they are grieving too. She noted recently becoming more emotional about the loss of her brother. She noted her efforts to manage this. She noted stressors related to a friend who has been pushy in regard spending time and making various plans. Traci Strong noted her efforts to set boundaries with her friend and noted this being an issue in the past, as well. She noted her friend being self-admittedly not a very compassionate person. She noted a need to reiterate boundaries. She noted her work to continue to [employer]. She is working on maintaining self-care. She noted her continued feelings of grief and sadness. Therapist validated and  normalized Traci Strong's feelings of grief and provided psycho-education regarding grief. We reviewed the importance of maintaining self-care. She noted the effect of not having the same routine, of being a care-taker, any more since her brother's passing. We worked on processing this during the session. Therapist validated Traci Strong's feelings and experience and provided supportive therapy. A follow-up was scheduled for continued treatment, which Traci Strong benefits from.   Interventions: grief and loss.   Diagnosis:  Adjustment disorder with mixed anxiety and depressed mood  Psychiatric Treatment: No , na   Treatment Plan:  Client Abilities/Strengths Traci Strong is intelligent, self-aware, and motivated for change.   Support System: Family and friends.   Client Treatment Preferences Outpatient therapy.   Client Statement of Needs Traci Strong would like to exercising consistently and frequently, eating more healthfully, address tasks at home, reconnect with family, manage symptoms, process past events, bolster coping skills, focusing on self.   Treatment Level Weekly  Symptoms  Anxiety: feeling anxious, difficulty managing worry, worrying too much, trouble relaxing, restlessness, feeling afraid something awful might happen.    (Status: maintained) Depression: Feeling down, fluctuating sleep, and lethargy.    (Status: declined)  Goals:   Traci Strong experiences symptoms of depression and anxiety.   Treatment plan signed and available on s-drive:  No, pending signature.    Target Date: 04/04/2024 Frequency: Weekly  Progress: 15% Modality: individual    Therapist will provide referrals for additional resources as appropriate.  Therapist will provide psycho-education regarding Traci Strong's diagnosis and corresponding treatment approaches and interventions. Licensed Clinical Social Worker, [place], LCSW will support the  patient's ability to achieve the goals identified. will employ CBT, BA, Problem-solving,  Solution Focused, Mindfulness,  coping skills, & other evidenced-based practices will be used to promote progress towards healthy functioning to help manage decrease symptoms associated with her diagnosis.   Reduce overall level, frequency, and intensity of the feelings of depression, anxiety and panic evidenced by decreased overall symptoms from 6 to 7 days/week to 0 to 1 days/week per client report for at least 3 consecutive months. Verbally express understanding of the relationship between feelings of depression, anxiety and their impact on thinking patterns and behaviors. Verbalize an understanding of the role that distorted thinking plays in creating fears, excessive worry, and ruminations.    Traci Strong participated in the creation of the treatment plan)    Elvie Mullet, LCSW

## 2024-01-13 DIAGNOSIS — L820 Inflamed seborrheic keratosis: Secondary | ICD-10-CM | POA: Diagnosis not present

## 2024-01-13 DIAGNOSIS — D485 Neoplasm of uncertain behavior of skin: Secondary | ICD-10-CM | POA: Diagnosis not present

## 2024-01-13 DIAGNOSIS — L2989 Other pruritus: Secondary | ICD-10-CM | POA: Diagnosis not present

## 2024-01-13 DIAGNOSIS — L438 Other lichen planus: Secondary | ICD-10-CM | POA: Diagnosis not present

## 2024-01-13 DIAGNOSIS — L538 Other specified erythematous conditions: Secondary | ICD-10-CM | POA: Diagnosis not present

## 2024-01-13 DIAGNOSIS — R208 Other disturbances of skin sensation: Secondary | ICD-10-CM | POA: Diagnosis not present

## 2024-01-30 ENCOUNTER — Ambulatory Visit: Admitting: Psychology

## 2024-01-30 DIAGNOSIS — F4323 Adjustment disorder with mixed anxiety and depressed mood: Secondary | ICD-10-CM

## 2024-01-30 NOTE — Progress Notes (Signed)
 [facility] Counselor/Therapist Progress Note  Patient ID: YUKA LALLIER, MRN: [id number]   Date: 01/30/24  Time Spent: 10:04 am - 10:53 am :49 Minutes  Treatment Type: Individual Therapy.  Reported Symptoms: anxiety and depression.   Mental Status Exam: Appearance:  Casual     Behavior: Appropriate  Motor: Normal  Speech/Language:  Normal Rate  Affect: Congruent  Mood: sad  Thought process: normal  Thought content:   WNL  Sensory/Perceptual disturbances:   WNL  Orientation: oriented to person, place, time/date, and situation  Attention: Good  Concentration: Good  Memory: WNL  Fund of knowledge:  Good  Insight:   Good  Judgment:  Good  Impulse Control: Good   Risk Assessment: Danger to Self:  No Self-injurious Behavior: No Danger to Others: No Duty to Warn:no Physical Aggression / Violence:No  Access to Firearms a concern: No  Gang Involvement:No   Subjective:   Doninique participated from home, via video and consented to treatment.  Therapist participated from home office.  Jeana reviewed the events of the past week. Evalette noted this past weekend being her birthday and noted feeling grief and sadness regarding her brother's passing and not receiving a call from him. She noted this eliciting additional grief  from her parents' passing and her partner's passing. She noted working on being by herself, reading, and watching TV. She noted spending some time with friends, for her birthday, and noted enjoying this experience. She noted having to write thank you notes for various people who provided gifts and were close to her brother and noted this being a difficult task. She noted that writing the notes is affirmation that her brother has passed away. She noted her experience with support from others and noted varying attempts from others. She noted some of which were not as supportive as she would like. She noted some family members not reaching out after the passing.  She noted the stress of managing her brother's estate and noted that this is keeping her busy. We worked on exploring her grief during the session and coping. She noted working on eating healthfully. She noted a need to re-engage in physical activity noting that she hasn't do so in some time. She noted her intent to be more engaging in this area. We discussed working on building on maintaining balance in self-care, taking breaks from managing the estate, and socializing. Tajia was engaged and motivated during the session. She expressed commitment towards session goals. Therapist provided supportive therapy and a follow-up was scheduled for continued treatment, which she benefits from.   Interventions: grief and loss.   Diagnosis:  Adjustment disorder with mixed anxiety and depressed mood  Psychiatric Treatment: No , na   Treatment Plan:  Client Abilities/Strengths Zuriah is intelligent, self-aware, and motivated for change.   Support System: Family and friends.   Client Treatment Preferences Outpatient therapy.   Client Statement of Needs Lorane would like to exercising consistently and frequently, eating more healthfully, address tasks at home, reconnect with family, manage symptoms, process past events, bolster coping skills, focusing on self.   Treatment Level Weekly  Symptoms  Anxiety: feeling anxious, difficulty managing worry, worrying too much, trouble relaxing, restlessness, feeling afraid something awful might happen.    (Status: maintained) Depression: Feeling down, fluctuating sleep, and lethargy.    (Status: declined)  Goals:   Sierra experiences symptoms of depression and anxiety.   Treatment plan signed and available on s-drive:  No, pending signature.    Target Date:  04/04/2024 Frequency: Weekly  Progress: 15% Modality: individual    Therapist will provide referrals for additional resources as appropriate.  Therapist will provide psycho-education regarding  Deneen's diagnosis and corresponding treatment approaches and interventions. Licensed Clinical Social Worker, [place], LCSW will support the patient's ability to achieve the goals identified. will employ CBT, BA, Problem-solving, Solution Focused, Mindfulness,  coping skills, & other evidenced-based practices will be used to promote progress towards healthy functioning to help manage decrease symptoms associated with her diagnosis.   Reduce overall level, frequency, and intensity of the feelings of depression, anxiety and panic evidenced by decreased overall symptoms from 6 to 7 days/week to 0 to 1 days/week per client report for at least 3 consecutive months. Verbally express understanding of the relationship between feelings of depression, anxiety and their impact on thinking patterns and behaviors. Verbalize an understanding of the role that distorted thinking plays in creating fears, excessive worry, and ruminations.    [name] participated in the creation of the treatment plan)    Elvie Mullet, LCSW

## 2024-02-04 DIAGNOSIS — I10 Essential (primary) hypertension: Secondary | ICD-10-CM | POA: Diagnosis not present

## 2024-02-04 DIAGNOSIS — M7662 Achilles tendinitis, left leg: Secondary | ICD-10-CM | POA: Diagnosis not present

## 2024-02-25 ENCOUNTER — Ambulatory Visit
Admission: RE | Admit: 2024-02-25 | Discharge: 2024-02-25 | Disposition: A | Discharge status: Home / Self Care | Source: Ambulatory Visit | Attending: Nurse Practitioner | Admitting: Nurse Practitioner

## 2024-02-25 ENCOUNTER — Other Ambulatory Visit: Payer: Medicare PPO

## 2024-02-25 ENCOUNTER — Ambulatory Visit (HOSPITAL_COMMUNITY)
Admission: RE | Admit: 2024-02-25 | Discharge: 2024-02-25 | Disposition: A | Discharge status: Home / Self Care | Source: Ambulatory Visit | Attending: Nurse Practitioner | Admitting: Nurse Practitioner

## 2024-02-25 DIAGNOSIS — Z1231 Encounter for screening mammogram for malignant neoplasm of breast: Secondary | ICD-10-CM | POA: Diagnosis not present

## 2024-02-25 DIAGNOSIS — M810 Age-related osteoporosis without current pathological fracture: Secondary | ICD-10-CM | POA: Diagnosis not present

## 2024-02-25 DIAGNOSIS — M8589 Other specified disorders of bone density and structure, multiple sites: Secondary | ICD-10-CM | POA: Diagnosis not present

## 2024-02-25 DIAGNOSIS — Z780 Asymptomatic menopausal state: Secondary | ICD-10-CM | POA: Diagnosis not present

## 2024-02-26 ENCOUNTER — Ambulatory Visit: Payer: Self-pay | Admitting: Nurse Practitioner

## 2024-02-27 ENCOUNTER — Ambulatory Visit (INDEPENDENT_AMBULATORY_CARE_PROVIDER_SITE_OTHER): Admitting: Psychology

## 2024-02-27 DIAGNOSIS — F4323 Adjustment disorder with mixed anxiety and depressed mood: Secondary | ICD-10-CM | POA: Diagnosis not present

## 2024-02-27 NOTE — Progress Notes (Signed)
 [facility] Counselor/Therapist Progress Note  Patient ID: Traci Strong, MRN: [id number]   Date: 02/27/24  Time Spent: 10:05 am - 10:51 am  Treatment Type: Individual Therapy.  Reported Symptoms: anxiety and depression.   Mental Status Exam: Appearance:  Casual     Behavior: Appropriate  Motor: Normal  Speech/Language:  Normal Rate  Affect: Congruent  Mood: sad  Thought process: normal  Thought content:   WNL  Sensory/Perceptual disturbances:   WNL  Orientation: oriented to person, place, time/date, and situation  Attention: Good  Concentration: Good  Memory: WNL  Fund of knowledge:  Good  Insight:   Good  Judgment:  Good  Impulse Control: Good   Risk Assessment: Danger to Self:  No Self-injurious Behavior: No Danger to Others: No Duty to Warn:no Physical Aggression / Violence:No  Access to Firearms a concern: No  Gang Involvement:No   Subjective:   Traci Strong participated from home, via video and consented to treatment.  Therapist participated from home office.  Traci Strong reviewed the events of the past week. Traci Strong noted not being able to exercise and noted an issue with tendonitis that affected her ability to exercise. She noted being in considerable pain as a result. She noted her effort to address this issue and noted feeling better. She note her continued strain with her friend who often makes various comments that can be tone deaf and often directive. She noted her previous effort to communicate her needs but noted this been unfruitful. We worked on processing this during the session. We worked on identifying additional ways to communicate her concerns directly and assertively. Traci Strong noted the stress managing her brother's estate and noted her efforts to simplify the process for herself, but noted the financial cost of this. She noted her effort to make a list of activities and trips to engage in, going forward, including visiting her daughter and two  grand children. She noted her plans to have thanksgiving alone this year due to the passing her brother. She noted her plans to participate next year. We continued to process the recent loss of her brother. Therapist validated Traci Strong's feelings and experience, during the session, and provided supportive therapy. A follow-up was scheduled for continued treatment,which she continues to benefit from.   Interventions: grief and loss, interpersonal.   Diagnosis:  Adjustment disorder with mixed anxiety and depressed mood  Psychiatric Treatment: No , na   Treatment Plan:  Client Abilities/Strengths Traci Strong is intelligent, self-aware, and motivated for change.   Support System: Family and friends.   Client Treatment Preferences Outpatient therapy.   Client Statement of Needs Traci Strong would like to exercising consistently and frequently, eating more healthfully, address tasks at home, reconnect with family, manage symptoms, process past events, bolster coping skills, focusing on self.   Treatment Level Weekly  Symptoms  Anxiety: feeling anxious, difficulty managing worry, worrying too much, trouble relaxing, restlessness, feeling afraid something awful might happen.    (Status: maintained) Depression: Feeling down, fluctuating sleep, and lethargy.    (Status: declined)  Goals:   Traci Strong experiences symptoms of depression and anxiety.   Treatment plan signed and available on s-drive:  No, pending signature.    Target Date: 04/04/2024 Frequency: Weekly  Progress: 15% Modality: individual    Therapist will provide referrals for additional resources as appropriate.  Therapist will provide psycho-education regarding Traci Strong's diagnosis and corresponding treatment approaches and interventions. Licensed Clinical Social Worker, [place], LCSW will support the patient's ability to achieve the goals identified.  will employ CBT, BA, Problem-solving, Solution Focused, Mindfulness,  coping skills,  & other evidenced-based practices will be used to promote progress towards healthy functioning to help manage decrease symptoms associated with her diagnosis.   Reduce overall level, frequency, and intensity of the feelings of depression, anxiety and panic evidenced by decreased overall symptoms from 6 to 7 days/week to 0 to 1 days/week per client report for at least 3 consecutive months. Verbally express understanding of the relationship between feelings of depression, anxiety and their impact on thinking patterns and behaviors. Verbalize an understanding of the role that distorted thinking plays in creating fears, excessive worry, and ruminations.    Traci Strong participated in the creation of the treatment plan)    Traci Mullet, LCSW

## 2024-02-28 ENCOUNTER — Ambulatory Visit: Admitting: Allergy & Immunology

## 2024-02-28 ENCOUNTER — Encounter: Payer: Self-pay | Admitting: Allergy & Immunology

## 2024-02-28 VITALS — BP 108/78 | HR 69 | Temp 97.8°F | Resp 18 | Ht 61.0 in | Wt 171.0 lb

## 2024-02-28 DIAGNOSIS — J3089 Other allergic rhinitis: Secondary | ICD-10-CM | POA: Diagnosis not present

## 2024-02-28 DIAGNOSIS — R21 Rash and other nonspecific skin eruption: Secondary | ICD-10-CM

## 2024-02-28 DIAGNOSIS — J302 Other seasonal allergic rhinitis: Secondary | ICD-10-CM

## 2024-02-28 MED ORDER — LEVALBUTEROL HCL 1.25 MG/3ML IN NEBU: 1.2500 mg | INHALATION_SOLUTION | Freq: Once | RESPIRATORY_TRACT | Status: DC

## 2024-02-28 NOTE — Progress Notes (Signed)
 FOLLOW UP  Date of Service/Encounter:  02/28/24   Assessment:   Rash - discussed adding Nemluvio (failed hydrocortisone, clobetasol , triamcinolone, and tacrolimus)   Perennial and seasonal allergic rhinitis (grasses, weeds, trees, indoor molds, outdoor molds, dust mites, and cat)  Plan/Recommendations:   Rash - Continue twice daily moisturizing routine - Continue Dermaleve as needed for red or itchy areas - Consider adding on Nemluvio to help with itch control while the lichen planus resolves.  - Call us  if you want to try this.  - Consent signed to get all of our ducks in a row.   Allergic rhinitis - Continue allergen avoidance measures directed toward grass pollen, weed pollen, tree pollen, indoor mold, outdoor mold, dust mite, and cat. - Continue Atrovent  2 sprays in each nostril up to twice a day if needed for runny nose - Continue cetirizine  10 mg once a day if needed for runny nose or itch - Consider saline nasal rinses as needed for nasal symptoms. Use this before any medicated nasal sprays for best result.  3. Return in about 6 months (around 08/27/2024). You can have the follow up appointment with Dr. Iva or a Nurse Practicioner (our Nurse Practitioners are excellent and always have Physician oversight!).    Subjective:   Traci Strong is a 68 y.o. female presenting today for follow up of  Chief Complaint  Patient presents with   Follow-up    Patient presents to the office for a 6 month follow up. No issues.    Traci Strong has a history of the following: Patient Active Problem List   Diagnosis Date Noted   Idiopathic urticaria 08/28/2023   Seasonal and perennial allergic rhinitis 08/28/2023   Idiopathic peripheral neuropathy 10/19/2019   Paresthesia 10/15/2019    History obtained from: chart review and patient.  Discussed the use of AI scribe software for clinical note transcription with the patient and/or guardian, who gave verbal consent to  proceed.  Traci Strong is a 68 y.o. female presenting for a follow up visit.  She was last seen in April 2025.  At that time, she was continued on clobetasol  and for another topical steroid as needed.  For her allergic rhinitis, she was continued on cetirizine  and Atrovent .  Since last visit, she has done well.  Allergic Rhinitis Symptom History: Her allergies have not been problematic. She uses Atrovent  nasal spray as needed and takes cetirizine  over the counter. She has not been using much for her allergies recently and has plenty of nasal spray on hand.   Skin Symptom History: She has been experiencing persistent skin rashes and itching, diagnosed as lichen planus. She uses DermAleve, an over-the-counter ointment, which she orders online. The spots persist but are less severe and itch less than before. Previously, she would wake up at night due to severe itching, especially on her back, but this has improved. She has tried various treatments including clobetasol  and over-the-counter hydrocortisone. Tacrolimus caused a burning sensation, leading to its discontinuation. Insurance issues have prevented access to some prescribed medications, causing frustration. She has been on hydrocortisone, clobetasol , triamcinolone, and tacrolimus.   She has experienced oral lesions, for which her dentist provided a xylocaine solution mouthwash to numb the area. She does not currently need a refill for this.  She has a history of high blood pressure, which she attributes to stress, particularly following the recent passing of her brother from cancer and congestive heart failure. She is managing his estate, adding to her stress.  She resumed her blood pressure medication after noticing increased readings.  Otherwise, there have been no changes to her past medical history, surgical history, family history, or social history.    Review of systems otherwise negative other than that mentioned in the HPI.    Objective:    Blood pressure 108/78, pulse 69, temperature 97.8 F (36.6 C), temperature source Temporal, resp. rate 18, height 5' 1 (1.549 m), weight 171 lb (77.6 kg), SpO2 97%. Body mass index is 32.31 kg/m.    Physical Exam Vitals reviewed.  Constitutional:      Appearance: She is well-developed.     Comments: Very pleasant.  Cooperative with the exam.  HENT:     Head: Normocephalic and atraumatic.     Right Ear: Tympanic membrane, ear canal and external ear normal. No drainage, swelling or tenderness. Tympanic membrane is not injected, scarred, erythematous, retracted or bulging.     Left Ear: Tympanic membrane, ear canal and external ear normal. No drainage, swelling or tenderness. Tympanic membrane is not injected, scarred, erythematous, retracted or bulging.     Nose: Rhinorrhea present. No nasal deformity, septal deviation or mucosal edema.     Right Turbinates: Enlarged, swollen and pale.     Left Turbinates: Enlarged, swollen and pale.     Right Sinus: No maxillary sinus tenderness or frontal sinus tenderness.     Left Sinus: No maxillary sinus tenderness or frontal sinus tenderness.     Mouth/Throat:     Mouth: Mucous membranes are not pale and not dry.     Pharynx: Uvula midline.  Eyes:     General:        Right eye: No discharge.        Left eye: No discharge.     Conjunctiva/sclera: Conjunctivae normal.     Right eye: Right conjunctiva is not injected. No chemosis.    Left eye: Left conjunctiva is not injected. No chemosis.    Pupils: Pupils are equal, round, and reactive to light.  Cardiovascular:     Rate and Rhythm: Normal rate and regular rhythm.     Heart sounds: Normal heart sounds.  Pulmonary:     Effort: Pulmonary effort is normal. No tachypnea, accessory muscle usage or respiratory distress.     Breath sounds: Normal breath sounds. No wheezing, rhonchi or rales.     Comments: Moving air well in all lung fields. No increased work of breathing noted.  Chest:      Chest wall: No tenderness.  Abdominal:     Tenderness: There is no abdominal tenderness. There is no guarding or rebound.  Lymphadenopathy:     Head:     Right side of head: No submandibular, tonsillar or occipital adenopathy.     Left side of head: No submandibular, tonsillar or occipital adenopathy.     Cervical: No cervical adenopathy.  Skin:    Coloration: Skin is not pale.     Findings: No abrasion, erythema, petechiae or rash. Rash is not papular, urticarial or vesicular.  Neurological:     Mental Status: She is alert.  Psychiatric:        Behavior: Behavior is cooperative.      Diagnostic studies: none      Marty Shaggy, MD  [facility] 

## 2024-02-28 NOTE — Patient Instructions (Addendum)
 Rash - Continue twice daily moisturizing routine - Continue Dermaleve as needed for red or itchy areas - Consider adding on Nemluvio to help with itch control while the lichen planus resolves.  - Call us  if you want to try this.  - Consent signed to get all of our ducks in a row.   Allergic rhinitis - Continue allergen avoidance measures directed toward grass pollen, weed pollen, tree pollen, indoor mold, outdoor mold, dust mite, and cat. - Continue Atrovent  2 sprays in each nostril up to twice a day if needed for runny nose - Continue cetirizine  10 mg once a day if needed for runny nose or itch - Consider saline nasal rinses as needed for nasal symptoms. Use this before any medicated nasal sprays for best result.  3. Return in about 6 months (around 08/27/2024). You can have the follow up appointment with Dr. Iva or a Nurse Practicioner (our Nurse Practitioners are excellent and always have Physician oversight!).    Please inform us  of any Emergency Department visits, hospitalizations, or changes in symptoms. Call us  before going to the ED for breathing or allergy  symptoms since we might be able to fit you in for a sick visit. Feel free to contact us  anytime with any questions, problems, or concerns.  It was a pleasure to see you again today!  Websites that have reliable patient information: 1. American Academy of Asthma, Allergy , and Immunology: www.aaaai.org 2. Food Allergy  Research and Education (FARE): foodallergy.org 3. Mothers of Asthmatics: http://www.asthmacommunitynetwork.org 4. American College of Allergy , Asthma, and Immunology: www.acaai.org      "Like" us  on Facebook and Instagram for our latest updates!      A healthy democracy works best when [employer] participate! Make sure you are registered to vote! If you have moved or changed any of your contact information, you will need to get this updated before voting! Scan the QR codes below to learn more!

## 2024-03-02 DIAGNOSIS — L820 Inflamed seborrheic keratosis: Secondary | ICD-10-CM | POA: Diagnosis not present

## 2024-03-02 DIAGNOSIS — L538 Other specified erythematous conditions: Secondary | ICD-10-CM | POA: Diagnosis not present

## 2024-03-02 DIAGNOSIS — M7660 Achilles tendinitis, unspecified leg: Secondary | ICD-10-CM | POA: Diagnosis not present

## 2024-03-02 DIAGNOSIS — D485 Neoplasm of uncertain behavior of skin: Secondary | ICD-10-CM | POA: Diagnosis not present

## 2024-03-02 DIAGNOSIS — G43101 Migraine with aura, not intractable, with status migrainosus: Secondary | ICD-10-CM | POA: Diagnosis not present

## 2024-03-02 DIAGNOSIS — L438 Other lichen planus: Secondary | ICD-10-CM | POA: Diagnosis not present

## 2024-03-02 DIAGNOSIS — L2989 Other pruritus: Secondary | ICD-10-CM | POA: Diagnosis not present

## 2024-03-19 NOTE — Telephone Encounter (Signed)
 Spoke with patient, OV scheduled for 04/02/24 at 1330 with Tiffany.   Routing to provider for final review. Patient is agreeable to disposition. Will close encounter.

## 2024-04-02 ENCOUNTER — Ambulatory Visit: Admitting: Psychology

## 2024-04-02 ENCOUNTER — Ambulatory Visit: Admitting: Nurse Practitioner

## 2024-04-02 ENCOUNTER — Encounter: Payer: Self-pay | Admitting: Nurse Practitioner

## 2024-04-02 VITALS — BP 112/72 | HR 67 | Ht 59.0 in | Wt 168.0 lb

## 2024-04-02 DIAGNOSIS — M810 Age-related osteoporosis without current pathological fracture: Secondary | ICD-10-CM

## 2024-04-02 DIAGNOSIS — F4323 Adjustment disorder with mixed anxiety and depressed mood: Secondary | ICD-10-CM

## 2024-04-02 NOTE — Progress Notes (Signed)
 [facility] Counselor/Therapist Progress Note  Patient ID: Traci Strong, MRN: [id number]   Date: 04/02/24  Time Spent: 10:06 am - 10:52 am:   46   Minutes  Treatment Type: Individual Therapy.  Reported Symptoms: anxiety and depression.   Mental Status Exam: Appearance:  Casual     Behavior: Appropriate  Motor: Normal  Speech/Language:  Normal Rate  Affect: Congruent  Mood: sad  Thought process: normal  Thought content:   WNL  Sensory/Perceptual disturbances:   WNL  Orientation: oriented to person, place, time/date, and situation  Attention: Good  Concentration: Good  Memory: WNL  Fund of knowledge:  Good  Insight:   Good  Judgment:  Good  Impulse Control: Good   Risk Assessment: Danger to Self:  No Self-injurious Behavior: No Danger to Others: No Duty to Warn:no Physical Aggression / Violence:No  Access to Firearms a concern: No  Gang Involvement:No   Subjective:   Calyssa Zobrist Talent participated from home, via video and consented to treatment.  Therapist participated from home office.  Micala reviewed the events of the past week. Nyari noted that this is living again. She noted making travel plans to visit daughter, going to events in the community, spending time with friends, completing home repairs, attending church, and attending a grief group. Therapist praised Khaleelah for her effort in this area and encouraged continued energy in this area. She noted that visiting an assisted living but noted this would be a bigger step. She noted continued feelings of grief and noted recently being triggered as she discovered her brother's watch as she was organizing. She noted being tearful during that time and having to discontinue that task in the moment. She noted financial stressors related to her brother's estate and noted her effort to navigate this with the help of an attorney. We worked on processing this and Desteny's feelings of stress. She is hopeful that this  issue can be resolved. She noted some interpersonal stressors with a friend who can be judgmental and critical, at times. She noted this being the reason she declined an invite for thanksgiving. She noted that she typically isn't a fan of thanksgiving and noted preferring to spend this holiday alone. She noted her marriage ending the day prior to thanksgiving as a contributing factor to her feelings about the holiday. Therapist validated Genessa's feelings and experience during the session, encouraged self-care, and provided supportive therapy. Therapist normalized Meila's feelings of grief during the session. A follow-up was scheduled for continued treatment, which Meila benefits from.    Interventions: CBT, grief and loss, interpersonal.   Diagnosis:  Adjustment disorder with mixed anxiety and depressed mood  Psychiatric Treatment: No , na   Treatment Plan:  Client Abilities/Strengths [place] is intelligent, self-aware, and motivated for change.   Support System: Family and friends.   Client Treatment Preferences Outpatient therapy.   Client Statement of Needs Tyniesha would like to exercising consistently and frequently, eating more healthfully, address tasks at home, reconnect with family, manage symptoms, process past events, bolster coping skills, focusing on self.   Treatment Level Weekly  Symptoms  Anxiety: feeling anxious, difficulty managing worry, worrying too much, trouble relaxing, restlessness, feeling afraid something awful might happen.    (Status: maintained) Depression: Feeling down, fluctuating sleep, and lethargy.    (Status: declined)  Goals:   Shirlyn experiences symptoms of depression and anxiety.   Treatment plan signed and available on s-drive:  No, pending signature.    Target Date: 04/04/2024 Frequency:  Weekly  Progress: 15% Modality: individual    Therapist will provide referrals for additional resources as appropriate.  Therapist will provide  psycho-education regarding Liviah's diagnosis and corresponding treatment approaches and interventions. Licensed Clinical Social Worker, [place], LCSW will support the patient's ability to achieve the goals identified. will employ CBT, BA, Problem-solving, Solution Focused, Mindfulness,  coping skills, & other evidenced-based practices will be used to promote progress towards healthy functioning to help manage decrease symptoms associated with her diagnosis.   Reduce overall level, frequency, and intensity of the feelings of depression, anxiety and panic evidenced by decreased overall symptoms from 6 to 7 days/week to 0 to 1 days/week per client report for at least 3 consecutive months. Verbally express understanding of the relationship between feelings of depression, anxiety and their impact on thinking patterns and behaviors. Verbalize an understanding of the role that distorted thinking plays in creating fears, excessive worry, and ruminations.    [name] participated in the creation of the treatment plan)    Elvie Mullet, LCSW

## 2024-04-02 NOTE — Progress Notes (Signed)
   Acute Office Visit  Subjective:    Patient ID: Traci Strong, female    DOB: 1955-07-26, 68 y.o.   MRN: [id number]   HPI 68 y.o. presents today to discuss DXA results and osteoporosis management. Has been on Actonel  since 2019. Significant decrease in bone density of bilateral femoral necks. Right neck -2.3 (-1.1 in 2023), left neck -1.8 (-1.2 in 2023). Spine stable at -1.7. Switching to Prolia recommended and patient wanted to discuss first.   No LMP recorded. Patient is postmenopausal.    Review of Systems  Constitutional: Negative.        Objective:    Physical Exam Constitutional:      Appearance: Normal appearance.     BP 112/72 (BP Location: Left Arm, Patient Position: Sitting, Cuff Size: Normal)   Pulse 67   Ht 4' 11 (1.499 m)   Wt 168 lb (76.2 kg)   SpO2 97%   BMI 33.93 kg/m  Wt Readings from Last 3 Encounters:  04/02/24 168 lb (76.2 kg)  02/28/24 171 lb (77.6 kg)  08/28/23 168 lb 6 oz (76.4 kg)        Assessment & Plan:   Problem List Items Addressed This Visit   None Visit Diagnoses       Age-related osteoporosis without current pathological fracture    -  Primary      Plan: DXA reviewed with patient. Educated on alternative management options, MOA, route, and potential side effects. Agreeable to Prolia. Written education provided on Prolia. GFR 49, Calcium 9.04 October 2023.    Traci DELENA Shutter DNP, 2:12 PM 04/02/2024

## 2024-04-10 ENCOUNTER — Other Ambulatory Visit: Payer: Self-pay | Admitting: *Deleted

## 2024-04-10 DIAGNOSIS — M810 Age-related osteoporosis without current pathological fracture: Secondary | ICD-10-CM

## 2024-04-10 MED ORDER — DENOSUMAB 60 MG/ML ~~LOC~~ SOSY: 60.0000 mg | PREFILLED_SYRINGE | SUBCUTANEOUS | Status: AC

## 2024-04-13 ENCOUNTER — Telehealth: Payer: Self-pay

## 2024-04-13 ENCOUNTER — Other Ambulatory Visit (HOSPITAL_COMMUNITY): Payer: Self-pay

## 2024-04-13 NOTE — Telephone Encounter (Signed)
 Prolia  VOB initiated via [email or web address]  Next Prolia  inj DUE: NEW START

## 2024-04-13 NOTE — Telephone Encounter (Signed)
 MEDICAL PA SUBMITTED VIA LATENT. KEY: [id number]   PHARMACY COPAY: $35

## 2024-04-13 NOTE — Telephone Encounter (Signed)
 SABRA

## 2024-04-14 NOTE — Telephone Encounter (Signed)
 Proceed with MTDM services  Med obtained from pharmacy and shipped to clinic:  Pharmacy benefit: Copay $64 (Paid to pharmacy) Admin Fee: $40 (Pay at clinic)  Prior Auth: N/A PA# Expiration Date:   # of doses approved:   Patient NOT eligible for Copay Card. Copay Card can make patient's cost as little as $25. Link to apply: [email or web address]://www.[email or web address].[email or web address]/copay  ** This summary of benefits is an estimation of the patient's out-of-pocket cost. Exact cost may very based on individual plan coverage.

## 2024-04-15 ENCOUNTER — Other Ambulatory Visit: Payer: Self-pay

## 2024-04-15 MED ORDER — DENOSUMAB 60 MG/ML ~~LOC~~ SOSY: 60.0000 mg | PREFILLED_SYRINGE | SUBCUTANEOUS | 1 refills | Status: DC

## 2024-04-15 NOTE — Addendum Note (Signed)
 Addended by: Kodah Maret T on: 04/15/2024 09:48 AM   Modules accepted: Orders

## 2024-04-15 NOTE — Progress Notes (Signed)
 Patient would like a call back on Monday to discuss. Will need MTDM visit.

## 2024-04-15 NOTE — Telephone Encounter (Signed)
 Prolia  #1, 1RF sent to [facility].

## 2024-04-20 ENCOUNTER — Other Ambulatory Visit: Payer: Self-pay

## 2024-04-20 NOTE — Progress Notes (Signed)
 Patient is agreeable to $64 copay and payment information has been collected. Patient aware she will need to speak with pharmacist before medication can be dispensed. MTDM visit scheduled. Will finish initial onboarding documentation once visit with Jen is complete.

## 2024-04-21 ENCOUNTER — Ambulatory Visit: Payer: Self-pay | Attending: Nurse Practitioner

## 2024-04-21 ENCOUNTER — Other Ambulatory Visit: Payer: Self-pay

## 2024-04-21 DIAGNOSIS — Z79899 Other long term (current) drug therapy: Secondary | ICD-10-CM

## 2024-04-21 DIAGNOSIS — M810 Age-related osteoporosis without current pathological fracture: Secondary | ICD-10-CM

## 2024-04-21 MED ORDER — DENOSUMAB 60 MG/ML ~~LOC~~ SOSY
60.0000 mg | PREFILLED_SYRINGE | SUBCUTANEOUS | 1 refills | Status: AC
  Filled 2024-04-21: qty 1, 180d supply, fill #0

## 2024-04-21 NOTE — Progress Notes (Signed)
 [facility]  Referring Provider: Annabella Shutter  Virtual Visit via Telephone Note  I connected with Traci Strong on 04/21/2024 at  2:30 PM EST by telephone and verified that I am speaking with the correct person using two identifiers.  Location: Patient: home Provider: office   I discussed the limitations, risks, security and privacy concerns of performing an evaluation and management service by telephone and the availability of in person appointments. I also discussed with the patient that there may be a patient responsible charge related to this service. The patient expressed understanding and agreed to proceed.   HPI: Traci Strong is a 68 y.o. female who presents to the pharmacotherapy clinic via telephone for initiation of therapy with Prolia  for osteoporosis.  Patient Active Problem List   Diagnosis Date Noted   Idiopathic urticaria 08/28/2023   Seasonal and perennial allergic rhinitis 08/28/2023   Idiopathic peripheral neuropathy 10/19/2019   Paresthesia 10/15/2019    Patient's Medications  New Prescriptions   No medications on file  Previous Medications   ACETAMINOPHEN PO    Take by mouth.   CETIRIZINE  (ZYRTEC ) 10 MG TABLET    Take 1 tablet (10 mg total) by mouth daily.   CLOBETASOL  OINTMENT (TEMOVATE ) 0.05 %    Apply 1 Application topically 2 (two) times a week. Initial dose: BID x 2 weeks, then one daily x 2 weeks, then twice weekly   IPRATROPIUM (ATROVENT ) 0.03 % NASAL SPRAY    Place 2 sprays into both nostrils 3 (three) times daily as needed for rhinitis.   KETOCONAZOLE (NIZORAL) 2 % SHAMPOO    Apply topically.   LIDOCAINE (XYLOCAINE) 2 % SOLUTION       LOPERAMIDE HCL (IMODIUM PO)    Take by mouth.   LOSARTAN (COZAAR) 100 MG TABLET    Take 100 mg by mouth daily.   NADOLOL (CORGARD) 80 MG TABLET    Take 120 mg by mouth daily.   SUMATRIPTAN (IMITREX) 50 MG TABLET    Take 50 mg by mouth every 2 (two) hours as needed for migraine. May repeat in 2  hours if headache persists or recurs.  Modified Medications   Modified Medication Previous Medication   DENOSUMAB  (PROLIA ) 60 MG/ML SOSY INJECTION denosumab  (PROLIA ) 60 MG/ML SOSY injection      Inject 60 mg into the skin every 6 (six) months.    Inject 60 mg into the skin every 6 (six) months.  Discontinued Medications   RISEDRONATE  (ACTONEL ) 150 MG TABLET    Take 1 tablet (150 mg total) by mouth every 30 (thirty) days. with water on empty stomach, nothing by mouth or lie down for next 30 minutes.    Allergies: Allergies[1]  Past Medical History: Past Medical History:  Diagnosis Date   Hypertension    Migraines    Numbness    Osteoporosis 10/2017   T score -2.7    Social History: Social History   Socioeconomic History   Marital status: Divorced    Spouse name: Not on file   Number of children: 1   Years of education: college   Highest education level: Master's degree (e.g., MA, MS, MEng, MEd, MSW, MBA)  Occupational History   Occupation: Retired  Tobacco Use   Smoking status: Never   Smokeless tobacco: Never  Vaping Use   Vaping status: Never Used  Substance and Sexual Activity   Alcohol use: No   Drug use: No   Sexual activity: Not Currently    Birth  control/protection: Post-menopausal    Comment: 1st intercourse- 18, partners- 3,   Other Topics Concern   Not on file  Social History Narrative   Lives alone.   Right-handed.   1-2 cups caffeine per day.   Social Drivers of Health   Tobacco Use: Low Risk (04/02/2024)   Patient History    Smoking Tobacco Use: Never    Smokeless Tobacco Use: Never    Passive Exposure: Not on file  Financial Resource Strain: Not on file  Food Insecurity: Not on file  Transportation Needs: Not on file  Physical Activity: Not on file  Stress: Not on file  Social Connections: Not on file  Depression (PHQ2-9): Medium Risk (03/07/2023)   Depression (PHQ2-9)    PHQ-2 Score: 6  Alcohol Screen: Not on file  Housing: Not on file   Utilities: Not on file  Health Literacy: Not on file    Medication: Prolia  (denosumab )  Assessment: Patient is stable and initiation of therapy with Prolia  is appropriate.  Vitamin D  Lab Results  Component Value Date   VD25OH 20.3 (L) 10/19/2019    Calcium Lab Results  Component Value Date   CALCIUM 9.6 10/03/2023    DEXA scan, most current DEXA scan T-score is minus 2.3 (02/25/24).  Plan:  -Counseled patient on purpose, proper use, and adverse effects of Prolia  (denosumab ).  Counseled patient that medication must be injected every 6 months by a healthcare professional.  Advised patient to take calcium 1200 mg daily and vitamin D  800 units daily.  Reviewed the most common adverse effects including risk of infection, osteonecrosis of the jaw, rash, and muscle/bone pain.  Patient confirms she does not have any major dental work planned at this time.  Reviewed with patient the signs/symptoms of low calcium and advised patient to alert us  if she experiences these symptoms.  - Patient will be scheduled for new start Prolia  (denosumab ) appointment at [facility] when medication arrives to office.  - Rx will be triaged to [facility] for courier to [facility].   I discussed the assessment and treatment plan with the patient. The patient was provided an opportunity to ask questions and all were answered. The patient agreed with the plan and demonstrated an understanding of the instructions.   The patient was advised to call back or seek an in-person evaluation if the symptoms worsen or if the condition fails to improve as anticipated.  I provided 15 minutes of non-face-to-face time during this encounter.  Delon Brow, PharmD, CSP, AAHIVP, CPP Clinical Pharmacist Practitioner - Medication Therapy Disease Management/Specialty Pharmacy Services 04/21/2024, 2:54 PM     [1]  Allergies Allergen Reactions   Amoxicillin-Pot  Clavulanate Rash    Dry heaving   Codeine Other (See Comments)    Increase heart rate   Prednisone Rash    Dry heaving

## 2024-04-21 NOTE — Progress Notes (Signed)
 Specialty Pharmacy Initial Fill Coordination Note  Traci Strong is a 68 y.o. female contacted today regarding initial fill of specialty medication(s) Denosumab  (PROLIA )   Patient requested Courier to Provider Office   Delivery date: 04/28/24   Verified address: [facility]  [address] [phone]   Medication will be filled on: 04/27/24   Patient is aware of $64 copayment.

## 2024-04-27 ENCOUNTER — Other Ambulatory Visit: Payer: Self-pay

## 2024-05-05 ENCOUNTER — Ambulatory Visit: Admitting: Psychology

## 2024-05-05 DIAGNOSIS — F4323 Adjustment disorder with mixed anxiety and depressed mood: Secondary | ICD-10-CM | POA: Diagnosis not present

## 2024-05-05 NOTE — Progress Notes (Addendum)
 [facility] Counselor/Therapist Progress Note  Patient ID: Traci Strong, MRN: [id number]   Date: 05/05/2024  Time Spent: 11:05 am - 11:48 am:   43  Minutes  Treatment Type: Individual Therapy.  Reported Symptoms: anxiety and depression.   Mental Status Exam: Appearance:  Casual     Behavior: Appropriate  Motor: Normal  Speech/Language:  Normal Rate  Affect: Congruent  Mood: normal  Thought process: normal  Thought content:   WNL  Sensory/Perceptual disturbances:   WNL  Orientation: oriented to person, place, time/date, and situation  Attention: Good  Concentration: Good  Memory: WNL  Fund of knowledge:  Good  Insight:   Good  Judgment:  Good  Impulse Control: Good   Risk Assessment: Danger to Self:  No Self-injurious Behavior: No Danger to Others: No Duty to Warn:no Physical Aggression / Violence:No  Access to Firearms a concern: No  Gang Involvement:No   Subjective:   Traci Strong participated from home, via video and consented to treatment.  Therapist participated from office.  Traci Strong reviewed the events of the past week. Traci Strong noted interpersonal stressors, with a church member, who made a negative comment regarding Traci Strong's upcoming trip to visit her daughter. She noted the risk of this interaction sullying her experience. She described this church member's general attitude being negative and noted her effort to ignore this feedback. She noted her trip being enjoyable despite the short duration. She noted suspecting that she experiences a down mood, during the winter, and noted the possibility of seasonal affective disorder. She noted her hope to increase physical exercise, socializing, and spending time outside of the home. Therapist praised Traci Strong for her effort regarding addressing her mood. She noted her intent to get her vitamin D  rechecked, as well. Therapist noted that if she does not see improvement in her mood, with these various interventions, that  research into therapeutic lights could be beneficial. Traci Strong was receptive to this during the session. Therapist validated Traci Strong's feelings and experience, during the session, encouraged self-care, and provided supportive therapy. A follow-up was scheduled for treatment, which she benefits from.    Interventions: CBT & interpersonal.   Diagnosis:  Adjustment disorder with mixed anxiety and depressed mood  Psychiatric Treatment: No , na   Treatment Plan:  Client Abilities/Strengths Traci Strong is intelligent, self-aware, and motivated for change.   Support System: Family and friends.   Client Treatment Preferences Outpatient therapy.   Client Statement of Needs Traci Strong would like to exercising consistently and frequently, eating more healthfully, address tasks at home, reconnect with family, manage symptoms, process past events, bolster coping skills, focusing on self.   Treatment Level Weekly  Symptoms  Anxiety: feeling anxious, difficulty managing worry, worrying too much, trouble relaxing, restlessness, feeling afraid something awful might happen.    (Status: maintained) Depression: Feeling down, fluctuating sleep, and lethargy.    (Status: declined)  Goals:   Traci Strong experiences symptoms of depression and anxiety.   Treatment plan signed and available on s-drive:  No, pending signature.    Target Date: 05/06/2024 Frequency: Weekly  Progress: 15% Modality: individual    Therapist will provide referrals for additional resources as appropriate.  Therapist will provide psycho-education regarding Traci Strong's diagnosis and corresponding treatment approaches and interventions. Licensed Clinical Social Worker, [place], LCSW will support the patient's ability to achieve the goals identified. will employ CBT, BA, Problem-solving, Solution Focused, Mindfulness,  coping skills, & other evidenced-based practices will be used to promote progress towards healthy functioning to help manage  decrease symptoms associated with her diagnosis.   Reduce overall level, frequency, and intensity of the feelings of depression, anxiety and panic evidenced by decreased overall symptoms from 6 to 7 days/week to 0 to 1 days/week per client report for at least 3 consecutive months. Verbally express understanding of the relationship between feelings of depression, anxiety and their impact on thinking patterns and behaviors. Verbalize an understanding of the role that distorted thinking plays in creating fears, excessive worry, and ruminations.    Traci Strong participated in the creation of the treatment plan)    Elvie Mullet, LCSW

## 2024-05-07 ENCOUNTER — Other Ambulatory Visit: Payer: Self-pay | Admitting: *Deleted

## 2024-05-07 DIAGNOSIS — M810 Age-related osteoporosis without current pathological fracture: Secondary | ICD-10-CM

## 2024-05-07 MED ORDER — DENOSUMAB 60 MG/ML ~~LOC~~ SOSY
60.0000 mg | PREFILLED_SYRINGE | Freq: Once | SUBCUTANEOUS | Status: AC
  Administered 2024-05-12: 60 mg via SUBCUTANEOUS

## 2024-05-12 ENCOUNTER — Ambulatory Visit

## 2024-05-12 DIAGNOSIS — M810 Age-related osteoporosis without current pathological fracture: Secondary | ICD-10-CM

## 2024-06-03 ENCOUNTER — Ambulatory Visit: Admitting: Psychology

## 2024-06-03 DIAGNOSIS — F4323 Adjustment disorder with mixed anxiety and depressed mood: Secondary | ICD-10-CM | POA: Diagnosis not present

## 2024-06-03 NOTE — Progress Notes (Signed)
 Comprehensive Clinical Assessment (CCA) Note  06/03/2024 Traci Strong [id number]  Time Spent: 11:03 am - 11:45  am: 42 Minutes  Chief Complaint: No chief complaint on file.  Visit Diagnosis: F43.23   Guardian/Payee:  Self    Paperwork requested: No   Reason for Visit /Presenting Problem: depression and anxiety.   Mental Status Exam: Appearance:   Well Groomed     Behavior:  Appropriate  Motor:  Normal  Speech/Language:   Clear and Coherent  Affect:  Congruent  Mood:  normal  Thought process:  normal  Thought content:    WNL  Sensory/Perceptual disturbances:    WNL  Orientation:  oriented to person, place, time/date, and situation  Attention:  Good  Concentration:  Good  Memory:  WNL  Fund of knowledge:   Good  Insight:    Good  Judgment:   Good  Impulse Control:  Good   Reported Symptoms:  Depression and anxiety.   Risk Assessment: Danger to Self:  No Self-injurious Behavior: No Danger to Others: No Duty to Warn:no Physical Aggression / Violence:No  Access to Firearms a concern: No  Gang Involvement:No  Patient / guardian was educated about steps to take if suicide or homicide risk level increases between visits: no While future psychiatric events cannot be accurately predicted, the patient does not currently require acute inpatient psychiatric care and does not currently meet [place]  involuntary commitment criteria.  Substance Abuse History: Current substance abuse: No     Caffeine: ~8oz -~16oz of mt dew and/or tea.  Tobacco: none Alcohol: none Substance use: none.   Past Psychiatric History:   Previous psychological history is significant for anxiety and depression Outpatient Providers:Joziah Dollins, LCSW History of Psych Hospitalization: No  Psychological Testing: n/a   Abuse History:  Victim of: No., n/a   Report needed: No. Victim of Neglect:No. Perpetrator of na  Witness / Exposure to Domestic Violence: No   Protective Services  Involvement: No  Witness to [employer] Violence:  No   Family History:  Family History  Problem Relation Age of Onset   Hypertension Mother    Heart attack Mother    Parkinson's disease Mother    Cancer Father        prostate   Hypertension Father    Aneurysm Father    Cancer Brother        thyroid    Hypertension Brother    Diabetes Maternal Grandmother    Heart attack Maternal Grandmother    Breast cancer Paternal Grandmother    Heart attack Paternal Grandmother    Hypertension Daughter     Living situation: the patient lives alone  Sexual Orientation: Straight  Relationship Status: widowed  Name of spouse / other: [name] (deceased) If a parent, number of children / ages: [name] - 49 - [name] (1 weeks)) and [name] (3).   Support Systems: friends and family. Will be starting grief support group in near future.   Financial Stress:  Yes , she noted currently maneuvering paying for two houses.  Income/Employment/Disability: Retired  Financial Planner: No   Educational History: Education: Risk Manager: christian  Any cultural differences that may affect / interfere with treatment:  not applicable   Recreation/Hobbies: Reading, puzzles, time with friends.  She noted I tend to think I need to do other stuff.   Stressors: Other: Finances, grief, recent snow storm.      Strengths: Supportive Relationships (Friends and family), intelligence, motivation for change, and drive.   Barriers:  NA   Legal History: Pending legal issue / charges: The patient has no significant history of legal issues. History of legal issue / charges: na  Medical History/Surgical History: reviewed Past Medical History:  Diagnosis Date   Hypertension    Migraines    Numbness    Osteoporosis 10/2017   T score -2.7    Past Surgical History:  Procedure Laterality Date   TOOTH EXTRACTION     WISDOM TOOTH EXTRACTION      Medications: Current  Outpatient Medications  Medication Sig Dispense Refill   ACETAMINOPHEN PO Take by mouth.     cetirizine  (ZYRTEC ) 10 MG tablet Take 1 tablet (10 mg total) by mouth daily. (Patient not taking: Reported on 04/02/2024) 90 tablet 1   clobetasol  ointment (TEMOVATE ) 0.05 % Apply 1 Application topically 2 (two) times a week. Initial dose: BID x 2 weeks, then one daily x 2 weeks, then twice weekly 45 g 2   denosumab  (PROLIA ) 60 MG/ML SOSY injection Inject 60 mg into the skin every 6 (six) months. 1 mL 1   ipratropium (ATROVENT ) 0.03 % nasal spray Place 2 sprays into both nostrils 3 (three) times daily as needed for rhinitis. 30 mL 5   ketoconazole (NIZORAL) 2 % shampoo Apply topically. (Patient not taking: Reported on 04/02/2024)     lidocaine (XYLOCAINE) 2 % solution      Loperamide HCl (IMODIUM PO) Take by mouth.     losartan (COZAAR) 100 MG tablet Take 100 mg by mouth daily.     nadolol (CORGARD) 80 MG tablet Take 120 mg by mouth daily.     SUMAtriptan (IMITREX) 50 MG tablet Take 50 mg by mouth every 2 (two) hours as needed for migraine. May repeat in 2 hours if headache persists or recurs. (Patient not taking: Reported on 04/02/2024)     Current Facility-Administered Medications  Medication Dose Route Frequency Provider Last Rate Last Admin   denosumab  (PROLIA ) injection 60 mg  60 mg Subcutaneous Q6 months Wallace, Tiffany A, NP        Allergies  Allergen Reactions   Amoxicillin-Pot Clavulanate Rash    Dry heaving   Codeine Other (See Comments)    Increase heart rate   Prednisone Rash    Dry heaving    Diagnoses:  Adjustment disorder with mixed anxiety and depressed mood  Plan of Care: Continued outpatient therapy.   Narrative:   [name] participated from home, via video, and consented to treatment. Therapist participated from  home office. Traci Strong is a current patient and this is her annual revaluation. We reviewed the limits of confidentiality prior to the start of the evaluation  and Traci Strong expressed her understand and provided consent to proceed. Traci Strong was initially referred for counseling due to grief after the loss of her partner, [name]. Her recent stressors include financial stressors and grief and loss. She noted her brother's passing resulted in her having to manage bother's property. She noted this resulting in significant financial stressors and pressure to manage both households. She noted concerns about her brother's home, which requires additional maintenance. She noted having to juggle the various bills due to rising costs. She noted the recent loss of her brother has been significant and resulted in feelings of grief and loss. She noted some of the support she has received being frustrating due to her friends' approach. She noted If I hear he's in a better place, I will pull my hair out. She noted her worry about her friends  developing dementia and noted her anxiety that this might occur to her, as well. She denied any current concerns, however. Her current anxiety symptoms include feeling anxious and difficulty managing worry. Her depressive symptoms include loss of interest, feeling down, fluctuating sleep [middle insomnia - and difficulty returning to sleep], lethargy, and poor appetite. She denied any SI. Lekia would benefit from continued treatment to address mood, process past experiences and losses, and work on engaging in self-care on a consistent basis. We scheduled a follow-up to create a treatment. Therapist provided supportive therapy.    Traci Mullet, LCSW

## 2024-07-01 ENCOUNTER — Encounter: Admitting: Nurse Practitioner

## 2024-07-02 ENCOUNTER — Ambulatory Visit: Admitting: Psychology

## 2024-09-04 ENCOUNTER — Ambulatory Visit: Admitting: Allergy & Immunology
